# Patient Record
Sex: Female | Born: 1960 | Race: White | Hispanic: No | Marital: Married | Smoking: Current every day smoker
Health system: Southern US, Community
[De-identification: ages and names within clinical notes are randomized; demographics above are authoritative.]

## PROBLEM LIST (undated history)

## (undated) DIAGNOSIS — R51 Headache: Secondary | ICD-10-CM

## (undated) DIAGNOSIS — G4730 Sleep apnea, unspecified: Secondary | ICD-10-CM

## (undated) DIAGNOSIS — C801 Malignant (primary) neoplasm, unspecified: Secondary | ICD-10-CM

## (undated) DIAGNOSIS — E785 Hyperlipidemia, unspecified: Secondary | ICD-10-CM

## (undated) DIAGNOSIS — K219 Gastro-esophageal reflux disease without esophagitis: Secondary | ICD-10-CM

## (undated) DIAGNOSIS — I10 Essential (primary) hypertension: Secondary | ICD-10-CM

## (undated) DIAGNOSIS — F32A Depression, unspecified: Secondary | ICD-10-CM

## (undated) DIAGNOSIS — F329 Major depressive disorder, single episode, unspecified: Secondary | ICD-10-CM

## (undated) HISTORY — PX: WISDOM TOOTH EXTRACTION: SHX21

## (undated) HISTORY — PX: DILATION AND CURETTAGE OF UTERUS: SHX78

---

## 2000-07-24 ENCOUNTER — Ambulatory Visit (HOSPITAL_COMMUNITY): Admission: RE | Admit: 2000-07-24 | Discharge: 2000-07-24 | Payer: Self-pay | Admitting: Family Medicine

## 2000-07-24 ENCOUNTER — Encounter: Payer: Self-pay | Admitting: Family Medicine

## 2000-11-02 ENCOUNTER — Encounter: Payer: Self-pay | Admitting: Family Medicine

## 2000-11-02 ENCOUNTER — Ambulatory Visit (HOSPITAL_COMMUNITY): Admission: RE | Admit: 2000-11-02 | Discharge: 2000-11-02 | Payer: Self-pay | Admitting: Family Medicine

## 2005-08-31 ENCOUNTER — Ambulatory Visit: Payer: Self-pay | Admitting: *Deleted

## 2006-09-05 ENCOUNTER — Ambulatory Visit: Payer: Self-pay | Admitting: *Deleted

## 2007-12-23 ENCOUNTER — Ambulatory Visit: Payer: Self-pay | Admitting: Cardiology

## 2007-12-24 ENCOUNTER — Inpatient Hospital Stay (HOSPITAL_COMMUNITY): Admission: EM | Admit: 2007-12-24 | Discharge: 2007-12-25 | Payer: Self-pay | Admitting: Emergency Medicine

## 2007-12-24 ENCOUNTER — Encounter (INDEPENDENT_AMBULATORY_CARE_PROVIDER_SITE_OTHER): Payer: Self-pay | Admitting: Internal Medicine

## 2007-12-26 ENCOUNTER — Ambulatory Visit (HOSPITAL_COMMUNITY): Payer: Self-pay | Admitting: Neurology

## 2007-12-27 ENCOUNTER — Encounter (HOSPITAL_COMMUNITY): Admission: RE | Admit: 2007-12-27 | Discharge: 2008-01-07 | Payer: Self-pay | Admitting: Neurology

## 2008-03-17 ENCOUNTER — Ambulatory Visit (HOSPITAL_COMMUNITY): Payer: Self-pay | Admitting: Neurology

## 2008-03-17 ENCOUNTER — Encounter (HOSPITAL_COMMUNITY): Admission: RE | Admit: 2008-03-17 | Discharge: 2008-04-09 | Payer: Self-pay | Admitting: Oncology

## 2008-06-16 ENCOUNTER — Encounter (HOSPITAL_COMMUNITY): Admission: RE | Admit: 2008-06-16 | Discharge: 2008-07-16 | Payer: Self-pay | Admitting: Oncology

## 2008-06-16 ENCOUNTER — Ambulatory Visit (HOSPITAL_COMMUNITY): Payer: Self-pay | Admitting: Neurology

## 2009-01-15 ENCOUNTER — Encounter (HOSPITAL_COMMUNITY): Admission: RE | Admit: 2009-01-15 | Discharge: 2009-02-14 | Payer: Self-pay | Admitting: Neurology

## 2009-01-15 ENCOUNTER — Ambulatory Visit (HOSPITAL_COMMUNITY): Payer: Self-pay | Admitting: Neurology

## 2009-03-03 ENCOUNTER — Ambulatory Visit: Payer: Self-pay | Admitting: Family Medicine

## 2009-08-04 ENCOUNTER — Ambulatory Visit (HOSPITAL_COMMUNITY): Payer: Self-pay | Admitting: Psychiatry

## 2009-08-18 ENCOUNTER — Ambulatory Visit (HOSPITAL_COMMUNITY): Payer: Self-pay | Admitting: Psychiatry

## 2009-09-01 ENCOUNTER — Ambulatory Visit (HOSPITAL_COMMUNITY): Payer: Self-pay | Admitting: Psychiatry

## 2009-09-17 ENCOUNTER — Ambulatory Visit (HOSPITAL_COMMUNITY): Payer: Self-pay | Admitting: Psychiatry

## 2009-10-15 ENCOUNTER — Ambulatory Visit (HOSPITAL_COMMUNITY): Payer: Self-pay | Admitting: Psychiatry

## 2009-10-30 ENCOUNTER — Ambulatory Visit (HOSPITAL_COMMUNITY): Payer: Self-pay | Admitting: Psychiatry

## 2010-01-20 ENCOUNTER — Encounter: Admission: RE | Admit: 2010-01-20 | Discharge: 2010-01-20 | Payer: Self-pay | Admitting: Otolaryngology

## 2010-04-27 ENCOUNTER — Ambulatory Visit (HOSPITAL_COMMUNITY)
Admission: RE | Admit: 2010-04-27 | Discharge: 2010-04-27 | Payer: Self-pay | Source: Home / Self Care | Attending: Psychiatry | Admitting: Psychiatry

## 2010-07-27 ENCOUNTER — Ambulatory Visit: Payer: Self-pay | Admitting: Family Medicine

## 2010-08-24 NOTE — Discharge Summary (Signed)
[name], [name]              ACCOUNT NO.:  [id number]   MEDICAL RECORD NO.:  [id number]          PATIENT TYPE:  INP   LOCATION:  IC03                          FACILITY:  [facility]   PHYSICIAN:  Osvaldo Shipper, MD     DATE OF BIRTH:  July 19, 1960   DATE OF ADMISSION:  12/23/2007  DATE OF DISCHARGE:  09/15/2009LH                               DISCHARGE SUMMARY   PRIMARY CARE PHYSICIAN:  The patient's PMD is not in this community but  he does have a doctor.   DISCHARGE DIAGNOSES:  1. Left-sided numbness and weakness, resolved.  2. Abnormal MRI of the brain requiring neurological followup.  3. Dyslipidemia with LDL of 150 and triglycerides of 233, requiring      followup with primary medical doctor.   BRIEF HOSPITAL COURSE:  Briefly, this is a 50 year old Caucasian female  who presented to the ED with complaints of numbness of the left side of  the face and left upper extremity.  The patient's symptoms started about  a couple of days ago.  She denied any swallowing difficulties.  She did  have some headache.  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  As a result, the patient was  admitted to the hospital.  She underwent MRI of the brain which revealed  no evidence for acute ischemia.  However, nonspecific subcortical white  matter changes supratentorially were seen and they are thought to  represent areas of ischemic gliosis  related to small vessel disease  secondary to hypertension and/or diabetes versus demyelinating process  or vasculitis.  The patient's MRA did not show any significant disease.  Carotid Dopplers also did not show any significant disease.  She also  had an echocardiogram which showed an EF of 60-65%.  Trivial aortic  regurgitation was noted.  Otherwise no other significant abnormalities  were present.   The patient's left-sided numbness has  completely resolved.  She has been  ambulating with no difficulty and not requiring assistance.  We did  order consultation with Dr. Gerilyn Pilgrim.  He did not see her yesterday  because of miscommunication.  We are waiting for the neurologist to see  her this morning.  However, since the patient has improved, I think it  may also be reasonable for her to see him as an outpatient.  So we will  probably discharge her by 9:30 this morning if he does not see her.  I  have explained the MRI findings to her.  I have told her that Dr.  Gerilyn Pilgrim will need to discuss those findings in greater detail with her  and she might have to undergo additional testing.   On the day of discharge she is feeling quite well.  No complaints  offered.  Her symptoms resolved completely.  Vital signs are all stable.  Blood pressure is very well controlled without medications.  Examination  otherwise was unremarkable.  Neurologically she does not have any more  focal deficits.   DISCHARGE MEDICATIONS:  Bayer aspirin 81 mg daily.  She also needs to be  on a statin which I have discussed with her and I will actually go ahead  and prescribe, and I have told her to discuss this with her doctor as  well.   Followup with Dr. Gerilyn Pilgrim as soon as possible and with her PMD within 2-  3 weeks.   ACTIVITY:  No restrictions.   DIET:  Heart healthy.   Total time on this discharge encounter:  35 minutes.   ADDENDUM:  Patient was seen by Dr. Gerilyn Pilgrim prior to discharge. He felt that the  MRI was consistent with a diagnosis of Multiple Sclerosis. He ordered  pulse high dose steroids, with first dose to be given prior to discharge  and other doses to be received as an outpatient.      Osvaldo Shipper, MD  Electronically Signed     GK/MEDQ  D:  12/25/2007  T:  12/25/2007  Job:  [id number]   cc:   Darleen Crocker A. Gerilyn Pilgrim, M.D.  Fax: [phone]

## 2010-08-24 NOTE — H&P (Signed)
[name], [name]              ACCOUNT NO.:  [id number]   MEDICAL RECORD NO.:  [id number]          PATIENT TYPE:  INP   LOCATION:  IC03                          FACILITY:  [facility]   PHYSICIAN:  Margaretmary Dys, M.D.DATE OF BIRTH:  [birth year]   DATE OF ADMISSION:  12/23/2007  DATE OF DISCHARGE:  LH                              HISTORY & PHYSICAL   ADMISSION DIAGNOSES:  1. Acute cerebrovascular accident involving the right hemisphere.  2. Poorly controlled hypertension.  3. Uncontrolled dyslipidemia.   CHIEF COMPLAINT:  Numbness of the right arm and leg of 1 day duration.   HISTORY OF PRESENT ILLNESS:  [name] is a 50 year old female who was  diagnosed with hypertension several months ago, but who was not taking  any medications and did not follow up with her primary physician.   The patient complained of numbness which was fairly acute over the past  few hours.  The patient says that it has been coming on and off, but had  become persistent over the last several was which prompted her to come  to the hospital.  She said the numbness is mostly on the left side of  her face and left upper extremity.  She is not really sure if it affects  her legs.  She denies any weakness or falling to that left side.  She  denies any visual changes.  She said she had some headache around the  time of the numbness.  She has had no swallowing difficulty.  She denies  any nausea or vomiting, abdominal pain, no weight loss.  No night sweats  of sick contacts recently.  The patient denies any illicit drug use.  Evaluation in the emergency room revealed an abnormal CT scan on the  right hemisphere raising concern for possible cerebrovascular accident.  The patient has now been admitted for the management and care.   REVIEW OF SYSTEMS:  A 10-point review of systems otherwise negative  except as mentioned in history of present illness above.   PAST MEDICAL HISTORY:  1. Hypertension, which is not  being treated.  2. Dyslipidemia.  The patient not on any medications.   MEDICATIONS:  None.   ALLERGIES:  No known drug allergies.   FAMILY HISTORY:  Positive for hypertension and coronary artery disease.   SOCIAL HISTORY:  The patient is married, lives with her husband.  She  smokes about one pack of cigarettes a day.  She has two children.  She  does not drink alcohol.  She denies any illicit drug use.   PHYSICAL EXAMINATION:  GENERAL APPEARANCE:  The patient was conscious,  alert, comfortable, not in acute distress, well oriented in time, place  and person.  VITAL SIGNS:  Blood pressure on arrival in the emergency room was 161/99  with a pulse of 90, respiration was 16, temperature was 98.3 degrees  Fahrenheit, oxygen saturation was 100% on room air.  HEENT:  Normocephalic, atraumatic.  Oral mucosa was moist with no  exudates.  NECK:  Supple.  No JVD or lymphadenopathy.  LUNGS:  Clear clinically with good air entry bilaterally.  HEART:  S1-S2 regular.  No S3, S4, gallops or rubs.  ABDOMEN:  Soft, nontender.  Bowel sounds positive.  No masses palpable.  EXTREMITIES:  No pitting pedal edema.  No calf induration or tenderness  was noted.  CNS:  The patient was conscious, alert, well-oriented to time, place and  person.  There was no objective evidence of decreased sensation or palsy  or weakness in any of her extremities.  Pronator drift was negative.   LABORATORY/DIAGNOSTIC DATA:  White blood cell count was 9.7, hemoglobin  13.4, hematocrit 39.1, platelet count was 255 with no left shift.  Sodium is 140, potassium 3.9, chloride 108, CO2 was 27, glucose 107, BUN  of 9, creatinine was 0.77.  Liver function test was normal.   A CT scan of the head showed subacute to chronic subcortical stroke  within the right centrum semiovale.   ASSESSMENT/PLAN:  [name] is a 51 year old female presenting with  some left-sided numbness, CT scan suggestive of an acute stroke.   PLAN:  1.  Admit to the medical floor with telemetry.  2. The patient will have workup for stroke including MRI and MRA in      the morning.  3. We will start patient on aspirin 81 mg p.o. once a day.  4. We will monitor her blood pressure very closely.  Will not start on      antihypertensives for now.  5. Obtain additional blood work including a fasting lipid profile and      an RPR in the morning.  6. I will request neurology consult with Dr. Gerilyn Pilgrim.  7. Will obtain a carotid Doppler and also an echocardiogram to be read      by [facility] in the morning.  8. Will obtain physical therapy consult, however, the patient did not      seem to have any evidence of neurologic deficits during my exam.   We have discussed the above plan with the patient who verbalized full  understanding.      Margaretmary Dys, M.D.  Electronically Signed     AM/MEDQ  D:  12/24/2007  T:  12/24/2007  Job:  [id number]

## 2010-08-24 NOTE — Consult Note (Signed)
[name], [name]              ACCOUNT NO.:  [id number]   MEDICAL RECORD NO.:  [id number]          PATIENT TYPE:  INP   LOCATION:  IC03                          FACILITY:  [facility]   PHYSICIAN:  Kofi A. Gerilyn Pilgrim, M.D. DATE OF BIRTH:  July 16, 1960   DATE OF CONSULTATION:  12/25/2007  DATE OF DISCHARGE:  12/25/2007                                 CONSULTATION   The patient is a 50 year old white female who was diagnosed with  hypertension several months ago.  She was not on any medications.  Blood  pressure has been controlled here in the hospital.  She presented with  numbness of the left upper extremity and left facial region.  This  started initially above in the left hand and then progressed above the  entire left upper extremity and left facial region.  She had somewhat of  stuttering and fluctuating course.  The total event appears to be lasted  about 24-36 hours.  She reports being normal now.  There was no previous  history of any events like this.  She denies any headaches or  dysarthria.  She does report having some weakness involving the left  extremity, the left leg seem not to be involved.   PAST MEDICAL HISTORY:  1. Dyslipidemia.  2. Hypertension.   ALLERGIES:  None known.   FAMILY HISTORY:  Significant for hypertension and coronary artery  disease.   SOCIAL HISTORY:  She is married, lives with her husband.  She smokes a  pack of cigarette per day.  She has 2 children.  No alcohol use.  No  illicit drug use.   REVIEW OF SYSTEMS:  Unremarkable other than stated in the history of  present illness.   PHYSICAL EXAMINATION:  GENERAL:  Shows a pleasant lady in no acute  distress.  VITAL SIGNS:  Blood pressure 130/78 and pulse 77.  HEENT:  Neck is supple.  Head is normocephalic atraumatic.  ABDOMEN:  Soft.  EXTREMITIES:  No significant edema or varicosities.  MENTATION:  She is awake and alert.  Speech, language, and cognition are  intact.  CRANIAL NERVES:  Pupils  were equal, round, and reactive to light and  accommodation.  Extraocular movements are intact  The visual fields are  full.  Facial muscle strength is symmetric. Tongue is midline.  Uvula  midline.  Shoulder shrug is normal.  MOTOR EXAMINATION:  Shows normal tone, bulk, and strength.  There is no  pronator drift.  Sensation is normal to temperature and light touch at  this time.  Coordination,  there is no dysmetria, pass pointing, or  tremors.  Reflexes are preserved and essentially normal, plantars are  both flexor.   Initial head CT scan showed lucency involving the right centrum  semiovale.  MRI is reviewed in person and shows 16 white matter lesions  in the deep white matter area.  Several of these line are perpendicular  to the ventricle and she even have a couple that extends out from the  corpus callosum consistent with Dawson's fingers.  The test was done  without contrast.  MRA is negative.  Carotid Doppler is negative.  ESR  30, WBC 8.9, hemoglobin 13, platelet count of 239, LFTs normal.  Sodium  140, potassium 3.9, chloride 108, CO2 of 27, BUN 9, creatinine 0.7,  glucose 107, RPR nonreactive, C-reactive protein 0.3 normal, total  cholesterol high at 222, triglyceride also high at 233, and LDL 150, HDL  low at 24.   ASSESSMENT:  1. This is a patient who presents with left arm and face numbness and      weakness lasted for about 24-36 hours.  Given her age and the lack      of a discreet infarct on diffusion imaging, I believe this patient      has likely multiple sclerosis.  The MRI findings are highly      suggestive.  2. Dyslipidemia.  3. Hypertension.   RECOMMENDATIONS:  She will eventually have a spinal tap done.  In the  meantime, I think we will treat her with IV Solu-Medrol.  She is anxious  to go home.  We will give her the typical treatment of a gram for 3 days  IV.  We will start her initial today and she can get the other 2 doses  as an outpatient.   Thanks  for this consultation.      Kofi A. Gerilyn Pilgrim, M.D.  Electronically Signed     KAD/MEDQ  D:  12/26/2007  T:  12/26/2007  Job:  [id number]

## 2011-01-12 LAB — BASIC METABOLIC PANEL
BUN: 9
Chloride: 110
Creatinine, Ser: 0.66
GFR calc Af Amer: >60
GFR calc non Af Amer: >60

## 2011-01-12 LAB — CBC
Hemoglobin: 13.4
MCHC: 34.3
MCV: 85.4
MCV: 85.6
Platelets: 239
RBC: 4.5
RBC: 4.57
WBC: 8.9
WBC: 9.7

## 2011-01-12 LAB — LIPID PANEL
Cholesterol: 221 — ABNORMAL HIGH
HDL: 24 — ABNORMAL LOW
LDL Cholesterol: 150 — ABNORMAL HIGH
Total CHOL/HDL Ratio: 9.2
Triglycerides: 233 — ABNORMAL HIGH

## 2011-01-12 LAB — COMPREHENSIVE METABOLIC PANEL
ALT: 19
CO2: 27
Calcium: 8.9
Chloride: 108
Creatinine, Ser: 0.77
GFR calc non Af Amer: >60
Glucose, Bld: 107 — ABNORMAL HIGH
Total Bilirubin: 0.3

## 2011-01-12 LAB — DIFFERENTIAL
Basophils Absolute: 0.1
Basophils Absolute: 0.1
Eosinophils Absolute: 0.2
Eosinophils Relative: 3
Eosinophils Relative: 3
Lymphocytes Relative: 22
Lymphocytes Relative: 31
Lymphs Abs: 2.1
Lymphs Abs: 2.8
Neutro Abs: 5.3
Neutrophils Relative %: 59
Neutrophils Relative %: 68

## 2011-01-12 LAB — C-REACTIVE PROTEIN: CRP: 0.1 — ABNORMAL LOW (ref <0.6)

## 2011-01-12 LAB — SEDIMENTATION RATE: Sed Rate: 3

## 2011-08-19 ENCOUNTER — Encounter (INDEPENDENT_AMBULATORY_CARE_PROVIDER_SITE_OTHER): Payer: Self-pay | Admitting: *Deleted

## 2011-08-23 ENCOUNTER — Ambulatory Visit: Payer: Self-pay | Admitting: Family Medicine

## 2011-08-31 ENCOUNTER — Encounter (INDEPENDENT_AMBULATORY_CARE_PROVIDER_SITE_OTHER): Payer: Self-pay | Admitting: *Deleted

## 2011-08-31 ENCOUNTER — Telehealth (INDEPENDENT_AMBULATORY_CARE_PROVIDER_SITE_OTHER): Payer: Self-pay | Admitting: *Deleted

## 2011-08-31 ENCOUNTER — Other Ambulatory Visit (INDEPENDENT_AMBULATORY_CARE_PROVIDER_SITE_OTHER): Payer: Self-pay | Admitting: *Deleted

## 2011-08-31 DIAGNOSIS — Z1211 Encounter for screening for malignant neoplasm of colon: Secondary | ICD-10-CM

## 2011-08-31 MED ORDER — PEG-KCL-NACL-NASULF-NA ASC-C 100 G PO SOLR: 1.0000 | Freq: Once | ORAL | Status: DC

## 2011-08-31 NOTE — Telephone Encounter (Signed)
Patient needs movi prep 

## 2011-09-20 ENCOUNTER — Telehealth (INDEPENDENT_AMBULATORY_CARE_PROVIDER_SITE_OTHER): Payer: Self-pay | Admitting: *Deleted

## 2011-09-20 NOTE — Telephone Encounter (Signed)
PCP/Requesting MD: Dr Kara Mead williams  Name & DOB: [name] Jan 16, 2061     Procedure: TCS  Reason/Indication:  screening  Has patient had this procedure before?  no  If so, when, by whom and where?    Is there a family history of colon cancer?  no  Who?  What age when diagnosed?    Is patient diabetic?   no      Does patient have prosthetic heart valve?  no  Do you have a pacemaker?  no  Has patient had joint replacement within last 12 months?  no  Is patient on Coumadin, Plavix and/or Aspirin? yes  Medications: asa 81 mg daily, lisniopril 10 mg daily, nexium 40 mg daily, simvastatin 20 mg daily, cymbalta 60 mg daily, vit D, butal-acet 40 mg prn, imitrex 100 mg prn, phenergan 25 mg prn  Allergies: none  Medication Adjustment: asa 2 days  Procedure date & time: 10/12/11 at 1030

## 2011-09-23 NOTE — Telephone Encounter (Signed)
agree

## 2011-10-03 ENCOUNTER — Encounter (HOSPITAL_COMMUNITY): Payer: Self-pay | Admitting: Pharmacy Technician

## 2011-10-04 ENCOUNTER — Encounter (HOSPITAL_COMMUNITY): Payer: Self-pay | Admitting: Pharmacy Technician

## 2011-10-12 ENCOUNTER — Ambulatory Visit (HOSPITAL_COMMUNITY)
Admission: RE | Admit: 2011-10-12 | Discharge: 2011-10-12 | Disposition: A | Discharge status: Home / Self Care | Payer: BC Managed Care – PPO | Source: Ambulatory Visit | Attending: Internal Medicine | Admitting: Internal Medicine

## 2011-10-12 ENCOUNTER — Encounter (HOSPITAL_COMMUNITY): Payer: Self-pay | Admitting: *Deleted

## 2011-10-12 ENCOUNTER — Encounter (HOSPITAL_COMMUNITY)
Admission: RE | Disposition: A | Discharge status: Home / Self Care | Payer: Self-pay | Source: Ambulatory Visit | Attending: Internal Medicine

## 2011-10-12 DIAGNOSIS — E785 Hyperlipidemia, unspecified: Secondary | ICD-10-CM | POA: Insufficient documentation

## 2011-10-12 DIAGNOSIS — Z1211 Encounter for screening for malignant neoplasm of colon: Secondary | ICD-10-CM

## 2011-10-12 DIAGNOSIS — K644 Residual hemorrhoidal skin tags: Secondary | ICD-10-CM | POA: Insufficient documentation

## 2011-10-12 DIAGNOSIS — Z79899 Other long term (current) drug therapy: Secondary | ICD-10-CM | POA: Insufficient documentation

## 2011-10-12 DIAGNOSIS — I10 Essential (primary) hypertension: Secondary | ICD-10-CM | POA: Insufficient documentation

## 2011-10-12 HISTORY — PX: COLONOSCOPY: SHX5424

## 2011-10-12 HISTORY — DX: Depression, unspecified: F32.A

## 2011-10-12 HISTORY — DX: Sleep apnea, unspecified: G47.30

## 2011-10-12 HISTORY — DX: Essential (primary) hypertension: I10

## 2011-10-12 HISTORY — DX: Hyperlipidemia, unspecified: E78.5

## 2011-10-12 HISTORY — DX: Headache: R51

## 2011-10-12 HISTORY — DX: Major depressive disorder, single episode, unspecified: F32.9

## 2011-10-12 HISTORY — DX: Gastro-esophageal reflux disease without esophagitis: K21.9

## 2011-10-12 SURGERY — COLONOSCOPY
Anesthesia: Moderate Sedation

## 2011-10-12 MED ORDER — MEPERIDINE HCL 50 MG/ML IJ SOLN
INTRAMUSCULAR | Status: AC
  Filled 2011-10-12: qty 1

## 2011-10-12 MED ORDER — MIDAZOLAM HCL 5 MG/5ML IJ SOLN
INTRAMUSCULAR | Status: DC | PRN
  Administered 2011-10-12 (×5): 2 mg via INTRAVENOUS

## 2011-10-12 MED ORDER — SODIUM CHLORIDE 0.45 % IV SOLN
Freq: Once | INTRAVENOUS | Status: AC
  Administered 2011-10-12: 1000 mL via INTRAVENOUS

## 2011-10-12 MED ORDER — MEPERIDINE HCL 50 MG/ML IJ SOLN
INTRAMUSCULAR | Status: DC | PRN
  Administered 2011-10-12 (×3): 25 mg via INTRAVENOUS

## 2011-10-12 MED ORDER — STERILE WATER FOR IRRIGATION IR SOLN
Status: DC | PRN
  Administered 2011-10-12: 11:00:00

## 2011-10-12 MED ORDER — MIDAZOLAM HCL 5 MG/5ML IJ SOLN
INTRAMUSCULAR | Status: AC
  Filled 2011-10-12: qty 10

## 2011-10-12 NOTE — Op Note (Signed)
COLONOSCOPY PROCEDURE REPORT  PATIENT:  [name]  MR#:  [id number] Birthdate:  January 25, 1961, 51 y.o., female Endoscopist:  Dr. Malissa Hippo, MD Referred By:  Dr. Ulanda Edison, MD Procedure Date: 10/12/2011  Procedure:   Colonoscopy  Indications:  Patient is 51 year old Caucasian female who is undergoing average risk screening colonoscopy.  Informed Consent:  The procedure and risks were reviewed with the patient and informed consent was obtained.  Medications:  Demerol 75mg  IV Versed 10 mg IV  Description of procedure:  After a digital rectal exam was performed, that colonoscope was advanced from the anus through the rectum and colon to the area of the cecum, ileocecal valve and appendiceal orifice. The cecum was deeply intubated. These structures were well-seen and photographed for the record. From the level of the cecum and ileocecal valve, the scope was slowly and cautiously withdrawn. The mucosal surfaces were carefully surveyed utilizing scope tip to flexion to facilitate fold flattening as needed. The scope was pulled down into the rectum where a thorough exam including retroflexion was performed.  Findings:   Prep satisfactory. Noncompliant sigmoid colon normal mucosa throughout. Normal rectal mucosa Small hemorrhoids below the dentate line.  Therapeutic/Diagnostic Maneuvers Performed:  None  Complications:  None  Cecal Withdrawal Time:  9 minutes  Impression:  Examination performed to cecum. Noncompliant sigmoid colon. No evidence of colonic polyps. Small external hemorrhoids.  Recommendations:  Standard instructions given. Next screening exam in 10 years.  REHMAN,NAJEEB U  10/12/2011 11:31 AM  CC: Dr. Ulanda Edison, MD & Dr. Bonnetta Barry ref. provider found

## 2011-10-12 NOTE — H&P (Signed)
[name] is an 51 y.o. female.   Chief Complaint: Patient is here for colonoscopy. HPI: Patient is 51 year old Caucasian female was in for average risk and colonoscopy. This is patient's first exam. Family history is negative for colorectal carcinoma. She denies rectal bleeding or recent change in her bowel habits.  Past Medical History  Diagnosis Date  . Hypertension   . Hyperlipemia   . Depression   . Sleep apnea   . GERD (gastroesophageal reflux disease)   . Headache     Past Surgical History  Procedure Date  . Wisdom tooth extraction   . Dilation and curettage of uterus     1986    Family History  Problem Relation Age of Onset  . COPD Mother   . COPD Father    Social History:  reports that she has been smoking.  She does not have any smokeless tobacco history on file. She reports that she does not drink alcohol or use illicit drugs.  Allergies: No Known Allergies  Medications Prior to Admission  Medication Sig Dispense Refill  . aspirin EC 81 MG tablet Take 81 mg by mouth daily.      . butalbital-acetaminophen-caffeine (FIORICET, ESGIC) 50-325-40 MG per tablet Take 1 tablet by mouth every 6 (six) hours as needed. For migraines      . DULoxetine (CYMBALTA) 60 MG capsule Take 60 mg by mouth every evening.      Marland Kitchen esomeprazole (NEXIUM) 40 MG capsule Take 40 mg by mouth daily.      Marland Kitchen lisinopril (PRINIVIL,ZESTRIL) 10 MG tablet Take 10 mg by mouth every evening.      . peg 3350 powder (MOVIPREP) 100 G SOLR Take 1 kit (100 g total) by mouth once.  1 kit  0  . promethazine (PHENERGAN) 25 MG tablet Take 25 mg by mouth every 6 (six) hours as needed. For nausea/vomiting      . simvastatin (ZOCOR) 20 MG tablet Take 20 mg by mouth every evening.      . SUMAtriptan (IMITREX) 100 MG tablet Take 100 mg by mouth every 2 (two) hours as needed. For migraines      . Vitamin D, Ergocalciferol, (DRISDOL) 50000 UNITS CAPS Take 50,000 Units by mouth every 7 (seven) days. On wednesday          No results found for this or any previous visit (from the past 48 hour(s)). No results found.  ROS  Blood pressure 138/81, pulse 89, temperature 97.6 F (36.4 C), temperature source Oral, resp. rate 20, height 5\' 2"  (1.575 m), weight 145 lb (65.772 kg), last menstrual period 08/14/2011, SpO2 98.00%. Physical Exam  Constitutional: She appears well-developed and well-nourished.  HENT:  Mouth/Throat: Oropharynx is clear and moist.  Eyes: Conjunctivae are normal. No scleral icterus.  Neck: No thyromegaly present.  Cardiovascular: Normal rate, regular rhythm and normal heart sounds.   No murmur heard. Respiratory: Effort normal and breath sounds normal.  GI: Soft. She exhibits no distension and no mass. There is no tenderness.  Musculoskeletal: She exhibits no edema.  Lymphadenopathy:    She has no cervical adenopathy.  Neurological: She is alert.  Skin: Skin is warm.     Assessment/Plan Average risk screening colonoscopy.  REHMAN,NAJEEB U 10/12/2011, 10:46 AM

## 2011-10-17 ENCOUNTER — Encounter (HOSPITAL_COMMUNITY): Payer: Self-pay | Admitting: Internal Medicine

## 2013-08-30 ENCOUNTER — Encounter (INDEPENDENT_AMBULATORY_CARE_PROVIDER_SITE_OTHER): Payer: Self-pay | Admitting: *Deleted

## 2013-09-05 ENCOUNTER — Encounter (INDEPENDENT_AMBULATORY_CARE_PROVIDER_SITE_OTHER): Payer: Self-pay | Admitting: Internal Medicine

## 2013-09-05 ENCOUNTER — Ambulatory Visit (INDEPENDENT_AMBULATORY_CARE_PROVIDER_SITE_OTHER): Payer: 59 | Admitting: Internal Medicine

## 2013-09-05 VITALS — BP 110/70 | HR 56 | Temp 98.1°F | Ht 62.0 in | Wt 148.8 lb

## 2013-09-05 DIAGNOSIS — R142 Eructation: Secondary | ICD-10-CM

## 2013-09-05 DIAGNOSIS — E7800 Pure hypercholesterolemia, unspecified: Secondary | ICD-10-CM

## 2013-09-05 DIAGNOSIS — F32A Depression, unspecified: Secondary | ICD-10-CM | POA: Insufficient documentation

## 2013-09-05 DIAGNOSIS — F329 Major depressive disorder, single episode, unspecified: Secondary | ICD-10-CM

## 2013-09-05 DIAGNOSIS — F3289 Other specified depressive episodes: Secondary | ICD-10-CM

## 2013-09-05 DIAGNOSIS — R141 Gas pain: Secondary | ICD-10-CM

## 2013-09-05 DIAGNOSIS — R140 Abdominal distension (gaseous): Secondary | ICD-10-CM

## 2013-09-05 DIAGNOSIS — I10 Essential (primary) hypertension: Secondary | ICD-10-CM | POA: Insufficient documentation

## 2013-09-05 DIAGNOSIS — R143 Flatulence: Secondary | ICD-10-CM

## 2013-09-05 NOTE — Progress Notes (Addendum)
Subjective:     Patient ID: [name], female   DOB: 1961-02-15, 53 y.o.   MRN: [id number]  HPI Presents today with c/o that her allergies are bad. She says when she eats sometimes, it feels like foods are lodging.  She says her foods are going down very slow. Even when she drinks liquids, it feels like it is slow to go down. At night sometimes she has acid reflux.   A week ago (Wednesday), she was constipated.After 2-3 days, she took 2 stool softenesr, and she had a small BM Monday. Monday, she took another stool softener. Tuesday she had a couple of BMs but were small but she did feel better. She says she feels bloated now. She has some flatus, but not a lot. Tender to the touch in her epigastric region. Her appetite is good. No weight loss. No melena or bright red rectal bleeding.  10/12/2011 Colonoscopy Dr. Laural Golden: screening: Impression:  Examination performed to cecum.  Noncompliant sigmoid colon.  No evidence of colonic polyps.  Small external hemorrhoids.   Review of Systems Past Medical History  Diagnosis Date  . Hypertension   . Hyperlipemia   . Depression   . Sleep apnea   . GERD (gastroesophageal reflux disease)   . VPXTGGYI(948.5)     Past Surgical History  Procedure Laterality Date  . Wisdom tooth extraction    . Dilation and curettage of uterus      1986  . Colonoscopy  10/12/2011    Procedure: COLONOSCOPY;  Surgeon: Rogene Houston, MD;  Location: [facility];  Service: Endoscopy;  Laterality: N/A;  1030    No Known Allergies  Current Outpatient Prescriptions on File Prior to Visit  Medication Sig Dispense Refill  . esomeprazole (NEXIUM) 40 MG capsule Take 40 mg by mouth daily.      Marland Kitchen lisinopril (PRINIVIL,ZESTRIL) 10 MG tablet Take 5 mg by mouth every evening.       . simvastatin (ZOCOR) 20 MG tablet Take 20 mg by mouth every evening.      . Vitamin D, Ergocalciferol, (DRISDOL) 50000 UNITS CAPS Take 50,000 Units by mouth every 7 (seven) days. On wednesday        No current facility-administered medications on file prior to visit.        Objective:   Physical Exam  Filed Vitals:   09/05/13 1030  BP: 110/70  Pulse: 56  Temp: 98.1 F (36.7 C)  Height: 5\' 2"  (1.575 m)  Weight: 148 lb 12.8 oz (67.495 kg)   Alert and oriented. Skin warm and dry. Oral mucosa is moist.   . Sclera anicteric, conjunctivae is pink. Thyroid not enlarged. No cervical lymphadenopathy. Lungs clear. Heart regular rate and rhythm.  Abdomen is soft. Bowel sounds are positive. No hepatomegaly.  Firmness to epigastric region, with guarding.           Assessment:     Abdominal distention, bloating, change in her bowels. Abdominal pain. ? Etiology.     Plan:     CT abdomen/pelvis with CM. Will consider and EGD/ED when CT scan is back.

## 2013-09-05 NOTE — Patient Instructions (Signed)
CT abdomen/pelvis with CM. May need an EGD/ED after CT is completer.

## 2013-09-11 ENCOUNTER — Telehealth (INDEPENDENT_AMBULATORY_CARE_PROVIDER_SITE_OTHER): Payer: Self-pay | Admitting: Internal Medicine

## 2013-09-11 ENCOUNTER — Ambulatory Visit (HOSPITAL_COMMUNITY)
Admission: RE | Admit: 2013-09-11 | Discharge: 2013-09-11 | Disposition: A | Discharge status: Home / Self Care | Payer: 59 | Source: Ambulatory Visit | Attending: Internal Medicine | Admitting: Internal Medicine

## 2013-09-11 DIAGNOSIS — R141 Gas pain: Secondary | ICD-10-CM | POA: Insufficient documentation

## 2013-09-11 DIAGNOSIS — R140 Abdominal distension (gaseous): Secondary | ICD-10-CM

## 2013-09-11 DIAGNOSIS — R142 Eructation: Secondary | ICD-10-CM

## 2013-09-11 DIAGNOSIS — K769 Liver disease, unspecified: Secondary | ICD-10-CM

## 2013-09-11 DIAGNOSIS — R143 Flatulence: Secondary | ICD-10-CM

## 2013-09-11 DIAGNOSIS — K7460 Unspecified cirrhosis of liver: Secondary | ICD-10-CM | POA: Insufficient documentation

## 2013-09-11 MED ORDER — IOHEXOL 300 MG/ML  SOLN
100.0000 mL | Freq: Once | INTRAMUSCULAR | Status: AC | PRN
  Administered 2013-09-11: 100 mL via INTRAVENOUS

## 2013-09-11 NOTE — Telephone Encounter (Signed)
I discussed this case with Dr. Laural Golden, Abnormal CT with ? Liver lesions. Will get labs. She will need a liver biopsy in the near future.   Ann, EGD/ED with possible variceal banding.

## 2013-09-12 ENCOUNTER — Other Ambulatory Visit (INDEPENDENT_AMBULATORY_CARE_PROVIDER_SITE_OTHER): Payer: Self-pay | Admitting: *Deleted

## 2013-09-12 DIAGNOSIS — R140 Abdominal distension (gaseous): Secondary | ICD-10-CM

## 2013-09-12 DIAGNOSIS — R1310 Dysphagia, unspecified: Secondary | ICD-10-CM

## 2013-09-12 DIAGNOSIS — K769 Liver disease, unspecified: Secondary | ICD-10-CM

## 2013-09-12 LAB — COMPREHENSIVE METABOLIC PANEL
ALT: 63 U/L — AB (ref 0–35)
AST: 73 U/L — ABNORMAL HIGH (ref 0–37)
Albumin: 3.4 g/dL — ABNORMAL LOW (ref 3.5–5.2)
Alkaline Phosphatase: 161 U/L — ABNORMAL HIGH (ref 39–117)
BILIRUBIN TOTAL: 1.4 mg/dL — AB (ref 0.2–1.2)
BUN: 24 mg/dL — ABNORMAL HIGH (ref 6–23)
CO2: 33 meq/L — AB (ref 19–32)
CREATININE: 1.01 mg/dL (ref 0.50–1.10)
Calcium: 8.7 mg/dL (ref 8.4–10.5)
Chloride: 99 mEq/L (ref 96–112)
Glucose, Bld: 85 mg/dL (ref 70–99)
Potassium: 3.3 mEq/L — ABNORMAL LOW (ref 3.5–5.3)
Sodium: 144 mEq/L (ref 135–145)
Total Protein: 5.6 g/dL — ABNORMAL LOW (ref 6.0–8.3)

## 2013-09-12 NOTE — Telephone Encounter (Signed)
Patient wants to know if there is anything she can take for the bloating -- procedure is sch'd for 09/18/13

## 2013-09-13 ENCOUNTER — Encounter (HOSPITAL_COMMUNITY): Payer: Self-pay | Admitting: Pharmacy Technician

## 2013-09-13 LAB — CBC WITH DIFFERENTIAL/PLATELET
BASOS ABS: 0 10*3/uL (ref 0.0–0.1)
Basophils Relative: 0 % (ref 0–1)
EOS ABS: 0 10*3/uL (ref 0.0–0.7)
EOS PCT: 0 % (ref 0–5)
HCT: 41.6 % (ref 36.0–46.0)
Hemoglobin: 14.2 g/dL (ref 12.0–15.0)
LYMPHS ABS: 1.9 10*3/uL (ref 0.7–4.0)
Lymphocytes Relative: 14 % (ref 12–46)
MCH: 30.7 pg (ref 26.0–34.0)
MCHC: 34.1 g/dL (ref 30.0–36.0)
MCV: 90 fL (ref 78.0–100.0)
Monocytes Absolute: 0.5 10*3/uL (ref 0.1–1.0)
Monocytes Relative: 4 % (ref 3–12)
Neutro Abs: 11.2 10*3/uL — ABNORMAL HIGH (ref 1.7–7.7)
Neutrophils Relative %: 82 % — ABNORMAL HIGH (ref 43–77)
PLATELETS: 109 10*3/uL — AB (ref 150–400)
RBC: 4.62 MIL/uL (ref 3.87–5.11)
RDW: 14.5 % (ref 11.5–15.5)
WBC: 13.7 10*3/uL — ABNORMAL HIGH (ref 4.0–10.5)

## 2013-09-13 LAB — HEPATITIS B SURFACE ANTIGEN: HEP B S AG: NEGATIVE

## 2013-09-13 LAB — PROTIME-INR
INR: 1.15 (ref <1.50)
Prothrombin Time: 14.6 seconds (ref 11.6–15.2)

## 2013-09-13 LAB — FERRITIN: FERRITIN: 46 ng/mL (ref 10–291)

## 2013-09-13 LAB — AFP TUMOR MARKER: AFP TUMOR MARKER: 6.4 ng/mL (ref 0.0–8.0)

## 2013-09-13 LAB — HEPATITIS C ANTIBODY: HCV Ab: NEGATIVE

## 2013-09-14 ENCOUNTER — Emergency Department (HOSPITAL_COMMUNITY)
Admission: EM | Admit: 2013-09-14 | Discharge: 2013-09-14 | Disposition: A | Discharge status: Home / Self Care | Payer: 59 | Attending: Emergency Medicine | Admitting: Emergency Medicine

## 2013-09-14 ENCOUNTER — Encounter (HOSPITAL_COMMUNITY): Payer: Self-pay | Admitting: Emergency Medicine

## 2013-09-14 DIAGNOSIS — F329 Major depressive disorder, single episode, unspecified: Secondary | ICD-10-CM | POA: Insufficient documentation

## 2013-09-14 DIAGNOSIS — K219 Gastro-esophageal reflux disease without esophagitis: Secondary | ICD-10-CM | POA: Insufficient documentation

## 2013-09-14 DIAGNOSIS — F17200 Nicotine dependence, unspecified, uncomplicated: Secondary | ICD-10-CM | POA: Insufficient documentation

## 2013-09-14 DIAGNOSIS — R5383 Other fatigue: Secondary | ICD-10-CM

## 2013-09-14 DIAGNOSIS — Z79899 Other long term (current) drug therapy: Secondary | ICD-10-CM | POA: Insufficient documentation

## 2013-09-14 DIAGNOSIS — I10 Essential (primary) hypertension: Secondary | ICD-10-CM | POA: Insufficient documentation

## 2013-09-14 DIAGNOSIS — Z9889 Other specified postprocedural states: Secondary | ICD-10-CM | POA: Insufficient documentation

## 2013-09-14 DIAGNOSIS — R1907 Generalized intra-abdominal and pelvic swelling, mass and lump: Secondary | ICD-10-CM | POA: Insufficient documentation

## 2013-09-14 DIAGNOSIS — R160 Hepatomegaly, not elsewhere classified: Secondary | ICD-10-CM | POA: Insufficient documentation

## 2013-09-14 DIAGNOSIS — R5381 Other malaise: Secondary | ICD-10-CM | POA: Insufficient documentation

## 2013-09-14 DIAGNOSIS — F3289 Other specified depressive episodes: Secondary | ICD-10-CM | POA: Insufficient documentation

## 2013-09-14 DIAGNOSIS — Z3202 Encounter for pregnancy test, result negative: Secondary | ICD-10-CM | POA: Insufficient documentation

## 2013-09-14 DIAGNOSIS — E785 Hyperlipidemia, unspecified: Secondary | ICD-10-CM | POA: Insufficient documentation

## 2013-09-14 DIAGNOSIS — R110 Nausea: Secondary | ICD-10-CM | POA: Insufficient documentation

## 2013-09-14 LAB — URINALYSIS, ROUTINE W REFLEX MICROSCOPIC
GLUCOSE, UA: 100 mg/dL — AB
HGB URINE DIPSTICK: NEGATIVE
Ketones, ur: NEGATIVE mg/dL
Leukocytes, UA: NEGATIVE
Nitrite: NEGATIVE
Protein, ur: NEGATIVE mg/dL
SPECIFIC GRAVITY, URINE: 1.01 (ref 1.005–1.030)
Urobilinogen, UA: 2 mg/dL — ABNORMAL HIGH (ref 0.0–1.0)
pH: 6 (ref 5.0–8.0)

## 2013-09-14 LAB — PREGNANCY, URINE: Preg Test, Ur: NEGATIVE

## 2013-09-14 MED ORDER — SPIRONOLACTONE 25 MG PO TABS: 25.0000 mg | ORAL_TABLET | Freq: Every day | ORAL | Status: AC

## 2013-09-14 NOTE — ED Notes (Signed)
Discharge instructions and prescription given and reviewed with patient.  Patient verbalized understanding to take medication as prescribed and to follow up with GI doctor.  Patient discharged home in good condition.

## 2013-09-14 NOTE — ED Provider Notes (Signed)
CSN: [id number]     Arrival date & time 09/14/13  1735 History   First MD Initiated Contact with Patient 09/14/13 1817  This chart was scribed for Richarda Blade, MD by Anastasia Pall, ED Scribe. This patient was seen in room APA08/APA08 and the patient's care was started at 6:19 PM.     Chief Complaint  Patient presents with  . Abdominal Pain    (Consider location/radiation/quality/duration/timing/severity/associated sxs/prior Treatment) The history is provided by the patient and a relative. No language interpreter was used.   HPI Comments: [name] is a 53 y.o. female who presents to the Emergency Department complaining of intermittent abdominal pain and distention, with associated nausea, onset 2 weeks ago. She states she saw Dr. Laural Golden for GI issues including burning in her stomach and throat at that time. She had a CT abdomen resulting in spots on her liver. She denies being told she had fluid around her liver. She states she had blood work done there and will be getting the results in 2 days. She reports being set up for an endoscopy on 06/19.   She has tried Gas-X for her bloating without relief. She takes her nausea medication PRN. She reports she has not been eating as much due to the burning in her stomach and throat. She reports having bowel movements productive of minimal stool. She reports weakness secondary to her lack of eating. She denies fever, constipation, and any other associated symptoms. She lives in [place]. She has h/o smoking, decreased recently. She denies EtOH use.   PCP -  Dear, Trude Mcburney, MD  Past Medical History  Diagnosis Date  . Hypertension   . Hyperlipemia   . Depression   . Sleep apnea   . GERD (gastroesophageal reflux disease)   . VQMGQQPY(195.0)    Past Surgical History  Procedure Laterality Date  . Wisdom tooth extraction    . Dilation and curettage of uterus      1986  . Colonoscopy  10/12/2011    Procedure: COLONOSCOPY;  Surgeon:  Rogene Houston, MD;  Location: [facility];  Service: Endoscopy;  Laterality: N/A;  1030   Family History  Problem Relation Age of Onset  . COPD Mother   . COPD Father    History  Substance Use Topics  . Smoking status: Current Every Day Smoker -- 1.00 packs/day for 30 years  . Smokeless tobacco: Not on file     Comment: a pack or more a day x 30 yrs.  . Alcohol Use: No   OB History   Grav Para Term Preterm Abortions TAB SAB Ect Mult Living                 Review of Systems  Constitutional: Positive for fatigue (secondary to not eating). Negative for fever.  Gastrointestinal: Positive for nausea, abdominal pain and abdominal distention. Negative for diarrhea and constipation.  All other systems reviewed and are negative.  Allergies  Review of patient's allergies indicates no known allergies.  Home Medications   Prior to Admission medications   Medication Sig Start Date End Date Taking? Authorizing Provider  azelastine (ASTELIN) 0.1 % nasal spray Place into both nostrils 2 (two) times daily. Use in each nostril as directed   Yes Historical Provider, MD  citalopram (CELEXA) 10 MG tablet Take 10 mg by mouth daily.   Yes Historical Provider, MD  esomeprazole (NEXIUM) 40 MG capsule Take 40 mg by mouth daily.   Yes Historical Provider,  MD  lisinopril (PRINIVIL,ZESTRIL) 10 MG tablet Take 5 mg by mouth every evening.    Yes Historical Provider, MD  montelukast (SINGULAIR) 10 MG tablet Take 10 mg by mouth at bedtime.   Yes Historical Provider, MD  promethazine (PHENERGAN) 12.5 MG tablet Take 12.5 mg by mouth every 6 (six) hours as needed. nausea 06/30/13  Yes Historical Provider, MD  simvastatin (ZOCOR) 20 MG tablet Take 20 mg by mouth every evening.   Yes Historical Provider, MD  traZODone (DESYREL) 100 MG tablet Take 50-100 mg by mouth at bedtime. 08/25/13  Yes Historical Provider, MD  Vitamin D, Ergocalciferol, (DRISDOL) 50000 UNITS CAPS Take 50,000 Units by mouth every 7 (seven)  days. On wednesday    Historical Provider, MD   BP 122/73  Pulse 84  Temp(Src) 99 F (37.2 C) (Oral)  Resp 20  Ht 5\' 2"  (1.575 m)  SpO2 97% Physical Exam  Nursing note and vitals reviewed. Constitutional: She is oriented to person, place, and time. She appears well-developed and well-nourished.  HENT:  Head: Normocephalic and atraumatic.  Eyes: Conjunctivae and EOM are normal. Pupils are equal, round, and reactive to light.  Neck: Normal range of motion and phonation normal. Neck supple.  Cardiovascular: Normal rate, regular rhythm and intact distal pulses.   Pulmonary/Chest: Effort normal. She exhibits no tenderness.  Abdominal: Soft. Bowel sounds are normal. She exhibits mass. There is hepatomegaly.  Positive fluid wave. Midline mass consistent with hepatomegaly.   Musculoskeletal: Normal range of motion. She exhibits no edema and no tenderness.  Neurological: She is alert and oriented to person, place, and time. She exhibits normal muscle tone.  Skin: Skin is warm and dry.  Psychiatric: She has a normal mood and affect. Her behavior is normal. Judgment and thought content normal.   ED Course  Procedures (including critical care time)  DIAGNOSTIC STUDIES: Oxygen Saturation is 97% on room air, normal by my interpretation.    COORDINATION OF CARE: 6:30 PM- Discussed previous lab work results with pt including doubt of Hepatitis. Discussed treatment plan with pt at bedside and pt agreed to plan.   Medications - No data to display  Patient Vitals for the past 24 hrs:  BP Temp Temp src Pulse Resp SpO2 Height  09/14/13 1741 122/73 mmHg 99 F (37.2 C) Oral 84 20 97 % 5\' 2"  (1.575 m)     EKG Interpretation None      MDM   Final diagnoses:  Abdominal swelling, generalized    Evaluation is consistent with early abdominal ascites, secondary to nonspecific liver disease. No evidence for spontaneous bacterial peritonitis, acute pulmonary hepatitis, metabolic instability, or  serious bacterial infection.  Nursing Notes Reviewed/ Care Coordinated Applicable Imaging Reviewed Interpretation of Laboratory Data incorporated into ED treatment  The patient appears reasonably screened and/or stabilized for discharge and I doubt any other medical condition or other [facility] requiring further screening, evaluation, or treatment in the ED at this time prior to discharge.  Plan: Home Medications- Spironolactone; Home Treatments- rest; return here if the recommended treatment, does not improve the symptoms; Recommended follow up- PCP prn  I personally performed the services described in this documentation, which was scribed in my presence. The recorded information has been reviewed and is accurate.     Richarda Blade, MD 09/14/13 [phone]

## 2013-09-14 NOTE — ED Notes (Signed)
Complain of abdominal pain and bloating. Denies other symptoms

## 2013-09-14 NOTE — Discharge Instructions (Signed)
The swelling in your abdomen is most likely ascites fluid.   Followup with your GI doctor, as soon as possible to discuss further treatment options and to be evaluated for the swelling in your abdomen.  You can try using Maalox, or Mylanta, before meals and at bedtime to help with the burning pain in your chest.

## 2013-09-16 ENCOUNTER — Inpatient Hospital Stay (HOSPITAL_COMMUNITY): Payer: 59

## 2013-09-16 ENCOUNTER — Encounter (HOSPITAL_COMMUNITY): Payer: Self-pay | Admitting: Emergency Medicine

## 2013-09-16 ENCOUNTER — Inpatient Hospital Stay (HOSPITAL_COMMUNITY)
Admission: EM | Admit: 2013-09-16 | Discharge: 2013-09-20 | DRG: 435 | Disposition: A | Discharge status: Hospice | Payer: 59 | Attending: Family Medicine | Admitting: Family Medicine

## 2013-09-16 ENCOUNTER — Telehealth (INDEPENDENT_AMBULATORY_CARE_PROVIDER_SITE_OTHER): Payer: Self-pay | Admitting: Internal Medicine

## 2013-09-16 DIAGNOSIS — E785 Hyperlipidemia, unspecified: Secondary | ICD-10-CM | POA: Diagnosis present

## 2013-09-16 DIAGNOSIS — C801 Malignant (primary) neoplasm, unspecified: Secondary | ICD-10-CM | POA: Diagnosis present

## 2013-09-16 DIAGNOSIS — D696 Thrombocytopenia, unspecified: Secondary | ICD-10-CM

## 2013-09-16 DIAGNOSIS — Z7401 Bed confinement status: Secondary | ICD-10-CM

## 2013-09-16 DIAGNOSIS — E876 Hypokalemia: Secondary | ICD-10-CM | POA: Diagnosis present

## 2013-09-16 DIAGNOSIS — G9340 Encephalopathy, unspecified: Secondary | ICD-10-CM | POA: Diagnosis present

## 2013-09-16 DIAGNOSIS — G4730 Sleep apnea, unspecified: Secondary | ICD-10-CM | POA: Diagnosis present

## 2013-09-16 DIAGNOSIS — Z801 Family history of malignant neoplasm of trachea, bronchus and lung: Secondary | ICD-10-CM

## 2013-09-16 DIAGNOSIS — K769 Liver disease, unspecified: Secondary | ICD-10-CM

## 2013-09-16 DIAGNOSIS — R142 Eructation: Secondary | ICD-10-CM

## 2013-09-16 DIAGNOSIS — K7689 Other specified diseases of liver: Secondary | ICD-10-CM

## 2013-09-16 DIAGNOSIS — F32A Depression, unspecified: Secondary | ICD-10-CM

## 2013-09-16 DIAGNOSIS — R7401 Elevation of levels of liver transaminase levels: Secondary | ICD-10-CM

## 2013-09-16 DIAGNOSIS — E7800 Pure hypercholesterolemia, unspecified: Secondary | ICD-10-CM

## 2013-09-16 DIAGNOSIS — D6959 Other secondary thrombocytopenia: Secondary | ICD-10-CM | POA: Diagnosis present

## 2013-09-16 DIAGNOSIS — R17 Unspecified jaundice: Secondary | ICD-10-CM

## 2013-09-16 DIAGNOSIS — C7949 Secondary malignant neoplasm of other parts of nervous system: Secondary | ICD-10-CM

## 2013-09-16 DIAGNOSIS — R141 Gas pain: Secondary | ICD-10-CM

## 2013-09-16 DIAGNOSIS — F17200 Nicotine dependence, unspecified, uncomplicated: Secondary | ICD-10-CM | POA: Diagnosis present

## 2013-09-16 DIAGNOSIS — K219 Gastro-esophageal reflux disease without esophagitis: Secondary | ICD-10-CM | POA: Diagnosis present

## 2013-09-16 DIAGNOSIS — R188 Other ascites: Secondary | ICD-10-CM

## 2013-09-16 DIAGNOSIS — R7402 Elevation of levels of lactic acid dehydrogenase (LDH): Secondary | ICD-10-CM | POA: Diagnosis present

## 2013-09-16 DIAGNOSIS — R143 Flatulence: Secondary | ICD-10-CM

## 2013-09-16 DIAGNOSIS — I10 Essential (primary) hypertension: Secondary | ICD-10-CM

## 2013-09-16 DIAGNOSIS — D72829 Elevated white blood cell count, unspecified: Secondary | ICD-10-CM | POA: Diagnosis present

## 2013-09-16 DIAGNOSIS — C3400 Malignant neoplasm of unspecified main bronchus: Secondary | ICD-10-CM | POA: Diagnosis present

## 2013-09-16 DIAGNOSIS — F329 Major depressive disorder, single episode, unspecified: Secondary | ICD-10-CM

## 2013-09-16 DIAGNOSIS — R740 Nonspecific elevation of levels of transaminase and lactic acid dehydrogenase [LDH]: Secondary | ICD-10-CM

## 2013-09-16 DIAGNOSIS — C7931 Secondary malignant neoplasm of brain: Secondary | ICD-10-CM | POA: Diagnosis present

## 2013-09-16 DIAGNOSIS — R140 Abdominal distension (gaseous): Secondary | ICD-10-CM | POA: Diagnosis present

## 2013-09-16 DIAGNOSIS — R52 Pain, unspecified: Secondary | ICD-10-CM

## 2013-09-16 DIAGNOSIS — C787 Secondary malignant neoplasm of liver and intrahepatic bile duct: Principal | ICD-10-CM | POA: Diagnosis present

## 2013-09-16 HISTORY — DX: Malignant (primary) neoplasm, unspecified: C80.1

## 2013-09-16 LAB — CBC WITH DIFFERENTIAL/PLATELET
BASOS PCT: 0 % (ref 0–1)
Basophils Absolute: 0 10*3/uL (ref 0.0–0.1)
EOS ABS: 0 10*3/uL (ref 0.0–0.7)
Eosinophils Relative: 0 % (ref 0–5)
HCT: 40.4 % (ref 36.0–46.0)
HEMOGLOBIN: 13.4 g/dL (ref 12.0–15.0)
Lymphocytes Relative: 11 % — ABNORMAL LOW (ref 12–46)
Lymphs Abs: 1.5 10*3/uL (ref 0.7–4.0)
MCH: 31 pg (ref 26.0–34.0)
MCHC: 33.2 g/dL (ref 30.0–36.0)
MCV: 93.5 fL (ref 78.0–100.0)
MONOS PCT: 4 % (ref 3–12)
Monocytes Absolute: 0.5 10*3/uL (ref 0.1–1.0)
NEUTROS PCT: 84 % — AB (ref 43–77)
Neutro Abs: 11.1 10*3/uL — ABNORMAL HIGH (ref 1.7–7.7)
Platelets: 65 10*3/uL — ABNORMAL LOW (ref 150–400)
RBC: 4.32 MIL/uL (ref 3.87–5.11)
RDW: 14.6 % (ref 11.5–15.5)
WBC: 13.1 10*3/uL — ABNORMAL HIGH (ref 4.0–10.5)

## 2013-09-16 LAB — COMPREHENSIVE METABOLIC PANEL
ALT: 81 U/L — ABNORMAL HIGH (ref 0–35)
AST: 120 U/L — AB (ref 0–37)
Albumin: 3.2 g/dL — ABNORMAL LOW (ref 3.5–5.2)
Alkaline Phosphatase: 227 U/L — ABNORMAL HIGH (ref 39–117)
BILIRUBIN TOTAL: 6.6 mg/dL — AB (ref 0.3–1.2)
BUN: 27 mg/dL — ABNORMAL HIGH (ref 6–23)
CALCIUM: 8.9 mg/dL (ref 8.4–10.5)
CHLORIDE: 91 meq/L — AB (ref 96–112)
CO2: 33 meq/L — AB (ref 19–32)
Creatinine, Ser: 0.96 mg/dL (ref 0.50–1.10)
GFR calc Af Amer: 77 mL/min — ABNORMAL LOW (ref >90)
GFR, EST NON AFRICAN AMERICAN: 66 mL/min — AB (ref >90)
Glucose, Bld: 100 mg/dL — ABNORMAL HIGH (ref 70–99)
Potassium: 3.2 mEq/L — ABNORMAL LOW (ref 3.7–5.3)
SODIUM: 140 meq/L (ref 137–147)
Total Protein: 6.2 g/dL (ref 6.0–8.3)

## 2013-09-16 LAB — URINALYSIS, ROUTINE W REFLEX MICROSCOPIC
Glucose, UA: NEGATIVE mg/dL
HGB URINE DIPSTICK: NEGATIVE
Leukocytes, UA: NEGATIVE
Nitrite: NEGATIVE
PH: 5.5 (ref 5.0–8.0)
Protein, ur: NEGATIVE mg/dL
SPECIFIC GRAVITY, URINE: 1.025 (ref 1.005–1.030)
Urobilinogen, UA: 0.2 mg/dL (ref 0.0–1.0)

## 2013-09-16 LAB — URINE CULTURE: Colony Count: 6000

## 2013-09-16 LAB — PROTIME-INR
INR: 1.22 (ref 0.00–1.49)
Prothrombin Time: 15.1 seconds (ref 11.6–15.2)

## 2013-09-16 LAB — MRSA PCR SCREENING: MRSA by PCR: NEGATIVE

## 2013-09-16 LAB — MAGNESIUM: Magnesium: 2.7 mg/dL — ABNORMAL HIGH (ref 1.5–2.5)

## 2013-09-16 LAB — LACTATE DEHYDROGENASE: LDH: 1271 U/L — AB (ref 94–250)

## 2013-09-16 MED ORDER — LORAZEPAM 0.5 MG PO TABS
1.0000 mg | ORAL_TABLET | Freq: Three times a day (TID) | ORAL | Status: DC | PRN
  Administered 2013-09-16 – 2013-09-17 (×3): 1 mg via ORAL
  Filled 2013-09-16 (×3): qty 2

## 2013-09-16 MED ORDER — SODIUM CHLORIDE 0.9 % IV BOLUS (SEPSIS)
250.0000 mL | Freq: Once | INTRAVENOUS | Status: AC
  Administered 2013-09-16: 250 mL via INTRAVENOUS

## 2013-09-16 MED ORDER — SODIUM CHLORIDE 0.9 % IV SOLN: INTRAVENOUS | Status: AC

## 2013-09-16 MED ORDER — GADOBENATE DIMEGLUMINE 529 MG/ML IV SOLN
13.0000 mL | Freq: Once | INTRAVENOUS | Status: AC | PRN
  Administered 2013-09-16: 13 mL via INTRAVENOUS

## 2013-09-16 MED ORDER — PNEUMOCOCCAL VAC POLYVALENT 25 MCG/0.5ML IJ INJ
0.5000 mL | INJECTION | INTRAMUSCULAR | Status: AC
  Administered 2013-09-17: 0.5 mL via INTRAMUSCULAR
  Filled 2013-09-16: qty 0.5

## 2013-09-16 MED ORDER — ACETAMINOPHEN 650 MG RE SUPP: 650.0000 mg | Freq: Four times a day (QID) | RECTAL | Status: DC | PRN

## 2013-09-16 MED ORDER — ACETAMINOPHEN 325 MG PO TABS: 650.0000 mg | ORAL_TABLET | Freq: Four times a day (QID) | ORAL | Status: DC | PRN

## 2013-09-16 MED ORDER — ONDANSETRON HCL 4 MG/2ML IJ SOLN: 4.0000 mg | Freq: Four times a day (QID) | INTRAMUSCULAR | Status: DC | PRN

## 2013-09-16 MED ORDER — HYDROCODONE-ACETAMINOPHEN 5-325 MG PO TABS
1.0000 | ORAL_TABLET | ORAL | Status: DC | PRN
  Administered 2013-09-16 – 2013-09-17 (×2): 1 via ORAL
  Filled 2013-09-16: qty 1
  Filled 2013-09-16: qty 2
  Filled 2013-09-16: qty 1

## 2013-09-16 MED ORDER — ONDANSETRON HCL 4 MG PO TABS: 4.0000 mg | ORAL_TABLET | Freq: Four times a day (QID) | ORAL | Status: DC | PRN

## 2013-09-16 MED ORDER — LORAZEPAM 2 MG/ML IJ SOLN
1.0000 mg | Freq: Once | INTRAMUSCULAR | Status: AC
  Administered 2013-09-16: 1 mg via INTRAVENOUS
  Filled 2013-09-16: qty 1

## 2013-09-16 MED ORDER — PANTOPRAZOLE SODIUM 40 MG IV SOLR
40.0000 mg | Freq: Two times a day (BID) | INTRAVENOUS | Status: DC
  Administered 2013-09-16 – 2013-09-19 (×8): 40 mg via INTRAVENOUS
  Filled 2013-09-16 (×8): qty 40

## 2013-09-16 MED ORDER — ALUM & MAG HYDROXIDE-SIMETH 200-200-20 MG/5ML PO SUSP: 30.0000 mL | Freq: Four times a day (QID) | ORAL | Status: DC | PRN

## 2013-09-16 MED ORDER — LEVOFLOXACIN IN D5W 750 MG/150ML IV SOLN
750.0000 mg | INTRAVENOUS | Status: DC
  Administered 2013-09-16: 750 mg via INTRAVENOUS
  Filled 2013-09-16: qty 150

## 2013-09-16 MED ORDER — SODIUM CHLORIDE 0.9 % IV SOLN
INTRAVENOUS | Status: DC
Start: 2013-09-16 — End: 2013-09-16
  Administered 2013-09-16: 14:00:00 via INTRAVENOUS

## 2013-09-16 MED ORDER — POTASSIUM CHLORIDE CRYS ER 20 MEQ PO TBCR
40.0000 meq | EXTENDED_RELEASE_TABLET | Freq: Once | ORAL | Status: AC
  Administered 2013-09-16: 40 meq via ORAL
  Filled 2013-09-16: qty 2

## 2013-09-16 NOTE — Progress Notes (Signed)
ANTIBIOTIC CONSULT NOTE - INITIAL  Pharmacy Consult for Levaquin Indication: intra-abdominal infection  No Known Allergies  Patient Measurements: Height: 5\' 6"  (167.6 cm) Weight: 147 lb 11.3 oz (67 kg) IBW/kg (Calculated) : 59.3  Vital Signs: Temp: 98.4 F (36.9 C) (06/08 0944) Temp src: Oral (06/08 0944) BP: 96/53 mmHg (06/08 1130) Pulse Rate: 70 (06/08 1130) Intake/Output from previous day:   Intake/Output from this shift:    Labs:  Recent Labs  09/16/13 1029  WBC 13.1*  HGB 13.4  PLT 65*  CREATININE 0.96   Estimated Creatinine Clearance: 63.4 ml/min (by C-G formula based on Cr of 0.96). No results found for this basename: VANCOTROUGH, VANCOPEAK, VANCORANDOM, GENTTROUGH, GENTPEAK, GENTRANDOM, TOBRATROUGH, TOBRAPEAK, TOBRARND, AMIKACINPEAK, AMIKACINTROU, AMIKACIN,  in the last 72 hours   Microbiology: No results found for this or any previous visit (from the past 720 hour(s)).  Medical History: Past Medical History  Diagnosis Date  . Hypertension   . Hyperlipemia   . Depression   . Sleep apnea   . GERD (gastroesophageal reflux disease)   . [id number](817.7)    Assessment: 53yo female admitted with worsening abdominal distention.  Asked to begin Levaquin for suspected infection.  Pt has good renal fxn.  Estimated Creatinine Clearance: 63.4 ml/min (by C-G formula based on Cr of 0.96).  Goal of Therapy:  Eradicate infection.  Plan:  Levaquin 750mg  IV q24hrs Switch to PO when improved / indicated Monitor labs, renal fxn, progress, and cultures  Maritssa Haughton A Skippy [place] 09/16/2013,1:43 PM

## 2013-09-16 NOTE — Telephone Encounter (Signed)
Results given to husband.

## 2013-09-16 NOTE — Progress Notes (Addendum)
Subjective: Since I last evaluated the patient  Recently seen in our office 09/05/2013 with c/o dysphagia. Foods were very slow to go down. Liquids were also slow to go down. She also c/o bloating and tenderness to her epigastric region. She had had symptoms x 2 weeks before presenting to office. On exam noted to have enlarged liver with tenderness.  Area to epigastric region felt firm. CT at time of OV was ordered. CT ordered  which revealed IMPRESSION:  Very heterogeneous appearance of the liver and cirrhotic changes.  The heterogeneous appearance is concerning for potential underlying  multifocal hepatic lesions. Some of these areas may relate to  irregular fat deposition. Recommend further workup with MRI of the  abdomen pre and post gadolinium administration. Correlation  suggested with liver function tests and AFP level. Formal  GI/hepatology workup would be indicated. There is associated small  amount of ascites. This morning I spoke with her husband. He stated his wife has not eaten since last Thursday. She was lethargic and her abdomen was distended. I advised him to take Wyvonnia Lora to the ED.  Today, before admission, MRI abdomen had been ordered.      Hepatitis B and C were negative. AFP normal. PT/INR normal. Recent Cmet revealed a climbing bilirubin. Today, a paracentesis attempted, but there was not enough fluid. Weight has remained the same.      10/12/2011 Colonoscopy Dr. Laural Golden: screening:  Impression:  Examination performed to cecum.  Noncompliant sigmoid colon.  No evidence of colonic polyps.  Small external hemorrhoids.   Intake/Output from previous day:   Intake/Output this shift: Total I/O In: 150 [IV Piggyback:150] Out: -   General appearance: alert  Lab Results:  Recent Labs  09/16/13 1029  WBC 13.1*  HGB 13.4  HCT 40.4  PLT 65*   BMET  Recent Labs  09/16/13 1029  NA 140  K 3.2*  CL 91*  CO2 33*  GLUCOSE 100*  BUN 27*  CREATININE 0.96   CALCIUM 8.9   LFT  Recent Labs  09/16/13 1029  PROT 6.2  ALBUMIN 3.2*  AST 120*  ALT 81*  ALKPHOS 227*  BILITOT 6.6*   PT/INR  Recent Labs  09/16/13 1035  LABPROT 15.1  INR 1.22   Hepatitis Panel No results found for this basename: HEPBSAG, HCVAB, HEPAIGM, HEPBIGM,  in the last 72 hours C-Diff No results found for this basename: CDIFFTOX,  in the last 72 hours Fecal Lactopherrin No results found for this basename: FECLLACTOFRN,  in the last 72 hours  Studies/Results: Dg Chest Port 1 View  09/16/2013   CLINICAL DATA:  53 year old female with leukocytosis. Initial encounter.  EXAM: PORTABLE CHEST - 1 VIEW  COMPARISON:  CT Abdomen and Pelvis 09/11/2013.  FINDINGS: Portable AP upright view at 1418 hrs. Mildly lordotic view. Normal cardiac size and mediastinal contours. Visualized tracheal air column is within normal limits. Allowing for portable technique, the lungs are clear. No pneumothorax or pleural effusion identified.  IMPRESSION: No acute cardiopulmonary abnormality.   Electronically Signed   By: Lars Pinks M.D.   On: 09/16/2013 14:25    Medications: I have reviewed the patient's current medications.  Objective: Vital signs in last 24 hours: Temp:  [98.4 F (36.9 C)] 98.4 F (36.9 C) (06/08 0944) Pulse Rate:  [70-78] 70 (06/08 1130) Resp:  [18] 18 (06/08 0944) BP: (91-103)/(50-64) 96/53 mmHg (06/08 1130) SpO2:  [93 %-99 %] 94 % (06/08 1130) Weight:  [147 lb 11.3 oz (67 kg)] 147 lb  11.3 oz (67 kg) (06/08 1332)  Alert, Eye slightly icteric.Lungs: bilateral wheezes. HR regular.  Liver 6-7 fingerbreadth below rib case. BS positive.  Abdomen distended. Tenderness epigastric region. No edema to extremities.  Assessment/Plan: Hepatic lesions, abdominal distention, elevated liver enzymes. Liver carcinoma needs to be ruled out. MRI scheduled.    LOS: 0 days   Butch Penny 09/16/2013, 3:39 PM  GI attending note;patient interviewed and examined. Patient has  hepatomegaly with multiple filling defects concerning for neoplasm. AFP is normal. MRI of liver has been ordered. She possibly will need liver biopsy. Lorazepam ordered.

## 2013-09-16 NOTE — H&P (Signed)
[facility] History and Physical  [name] VQQ:[id number] DOB: 12-19-1960 DOA: 09/16/2013  Referring physician:  PCP: Dear, Trude Mcburney, MD   Chief Complaint:   HPI: [name] is a 53 y.o. female with a past medical history that includes hypertension, GERD, hyperlipidemia presents to the emergency department with the chief complaint of worsening abdominal distention. She reports that about 2 weeks ago she developed constipation and worsening reflux. She went to gastroenterology outpatient. She had a CT which there was concern about lesions in the liver biopsy is in the process of being scheduled. Since that time the abdominal distention has worsened. 2 days ago she came to the emergency for worsening abdominal distention. Associated symptoms include generalized weakness decreased by mouth intake and intermittent diarrhea. She denies any fever chills chest pain palpitation, lower extremity edema,  shortness of breath headache visual disturbances syncope or near-syncope. She states that lying flat makes it worse and she's had to sleep in a chair. She denies any abdominal pain she describes it as a "pressure". She denies history of EtOH use and denies history of IV drug use. Initial workup in the emergency department yields a complete metabolic panel yielding mild hypokalemia, elevated transaminases, leukocytosis. She is afebrile. Her blood pressure is mildly soft otherwise her vital signs are stable. She is not hypoxic  Review of Systems:  10 point review of systems complete and all systems are negative except as indicated in the history of present illness   Past Medical History  Diagnosis Date  . Hypertension   . Hyperlipemia   . Depression   . Sleep apnea   . GERD (gastroesophageal reflux disease)   . EPPIRJJO(841.6)    Past Surgical History  Procedure Laterality Date  . Wisdom tooth extraction    . Dilation and curettage of uterus      1986  . Colonoscopy  10/12/2011   Procedure: COLONOSCOPY;  Surgeon: Rogene Houston, MD;  Location: [facility];  Service: Endoscopy;  Laterality: N/A;  1030   Social History:  reports that she has been smoking.  She does not have any smokeless tobacco history on file. She reports that she does not drink alcohol or use illicit drugs.  No Known Allergies  Family History  Problem Relation Age of Onset  . COPD Mother   . COPD Father    she has 3 sisters and one brother who are in good health. Mother died complications of lung cancer. Father deceased from COPD  Prior to Admission medications   Medication Sig Start Date End Date Taking? Authorizing Provider  alum & mag hydroxide-simeth (MAALOX/MYLANTA) 200-200-20 MG/5ML suspension Take 15 mLs by mouth every 6 (six) hours as needed for indigestion or heartburn.   Yes Historical Provider, MD  citalopram (CELEXA) 10 MG tablet Take 10 mg by mouth daily.   Yes Historical Provider, MD  esomeprazole (NEXIUM) 40 MG capsule Take 40 mg by mouth daily.   Yes Historical Provider, MD  ibuprofen (ADVIL,MOTRIN) 200 MG tablet Take 400 mg by mouth daily as needed for headache.   Yes Historical Provider, MD  lisinopril (PRINIVIL,ZESTRIL) 10 MG tablet Take 5 mg by mouth every evening.    Yes Historical Provider, MD  promethazine (PHENERGAN) 12.5 MG tablet Take 12.5 mg by mouth every 6 (six) hours as needed. nausea 06/30/13  Yes Historical Provider, MD  simvastatin (ZOCOR) 20 MG tablet Take 20 mg by mouth every evening.   Yes Historical Provider, MD  spironolactone (ALDACTONE) 25 MG  tablet Take 1 tablet (25 mg total) by mouth daily. 09/14/13  Yes Richarda Blade, MD  traZODone (DESYREL) 100 MG tablet Take 50-100 mg by mouth at bedtime. 08/25/13  Yes Historical Provider, MD  Vitamin D, Ergocalciferol, (DRISDOL) 50000 UNITS CAPS Take 50,000 Units by mouth every 7 (seven) days. On wednesday   Yes Historical Provider, MD   Physical Exam: Filed Vitals:   09/16/13 1130  BP: 96/53  Pulse: 70  Temp:     Resp:     BP 96/53  Pulse 70  Temp(Src) 98.4 F (36.9 C) (Oral)  Resp 18  SpO2 94%  LMP 08/14/2011  General:  Well-nourished calm cooperative no acute distress Eyes: PERRL, EOMI ENT: Ears clear nose without drainage oropharynx without erythema or exudate. Mucous membranes of her mouth are pink slightly dry Neck: no LAD, masses or thyromegaly Cardiovascular: RRR, no m/r/g. No LE edema. Pulses present and palpable Telemetry: SR, no arrhythmias  Respiratory: CTA bilaterally, no w/r/r. Normal respiratory effort. Abdomen: Fairly distended but mostly soft. Sluggish bowel sounds particularly in the upper quadrants. Moderate tenderness to palpation in upper right hand quadrant. Guarding no rebound Skin: no rash. Slightly orangish Musculoskeletal: grossly normal tone BUE/BLE Psychiatric: grossly normal mood and affect, speech fluent and appropriate Neurologic: grossly non-focal.          Labs on Admission:  Basic Metabolic Panel:  Recent Labs Lab 09/11/13 1112 09/16/13 1029  NA 144 140  K 3.3* 3.2*  CL 99 91*  CO2 33* 33*  GLUCOSE 85 100*  BUN 24* 27*  CREATININE 1.01 0.96  CALCIUM 8.7 8.9   Liver Function Tests:  Recent Labs Lab 09/11/13 1112 09/16/13 1029  AST 73* 120*  ALT 63* 81*  ALKPHOS 161* 227*  BILITOT 1.4* 6.6*  PROT 5.6* 6.2  ALBUMIN 3.4* 3.2*   No results found for this basename: LIPASE, AMYLASE,  in the last 168 hours No results found for this basename: AMMONIA,  in the last 168 hours CBC:  Recent Labs Lab 09/11/13 1112 09/16/13 1029  WBC 13.7* 13.1*  NEUTROABS 11.2* 11.1*  HGB 14.2 13.4  HCT 41.6 40.4  MCV 90.0 93.5  PLT 109* 65*   Cardiac Enzymes: No results found for this basename: CKTOTAL, CKMB, CKMBINDEX, TROPONINI,  in the last 168 hours  BNP (last 3 results) No results found for this basename: PROBNP,  in the last 8760 hours CBG: No results found for this basename: GLUCAP,  in the last 168 hours  Radiological Exams on  Admission: No results found.  EKG: I  Assessment/Plan Principal Problem:   Abdominal distension: Etiology unclear. CT done 2 days ago reveals cirrhotic liver with questionable lesions as well as perihepatic fluid. Since that time her distention has worsened and today's labs indicate her transaminase levels have worsened. she is afebrile and nontoxic appearing but does have a leukocytosis and somewhat soft blood pressure. We will admit her to the step down unit. We will request abdominal ultrasound as well as paracentesis per interventional radiology. Will obtain cytology, cell count, LDH, Gram stain and culture, protein levels on this fluid. She denies EtOH and IV drug use.  ?lesions per CT concerning for possible malignancy as patient is a lifelong smoker. Hep B surface negative HCV ab negative per chart review. She was seen last week by Dr. Laural Golden and liver biopsy in process of being scheduled. Will request GI consult. Will defer any further imaging to GI at this time. Active Problems: Elevated transaminase level: See #  1. CT yields cirrhotic like  liver. Hep B and C Negative. No history of EtOH or IV drug use. CT also with lesions concerning for malignancy. Chart review indicates that a liver biopsy is in the process of being scheduled per GI office. Of note her total bilirubin went from 1.4-6.6 in 2 days.  Ascites: CT 2 days ago with mild. Hepatic fluid. Worsening distention since then. Will obtain abdominal ultrasound. Will request paracentesis via interventional radiology. Will request GI consult. See #1  Leukocytosis: Currently she is afebrile and nontoxic appearing. See #1. Will obtain chest x-ray, urinalysis with culture, blood culture.  Will start empiric Levaquin per pharmacy as some concern for SBP. Will monitor closely    Hypokalemia: Mild. Likely due to decreased by mouth intake. Will replete and recheck in the a.m.    Thrombocytopenia: Likely related to liver function no signs symptoms  of bleeding. Will monitor    Essential hypertension, benign: Blood pressure somewhat soft while in the emergency department. I will hold her lisinopril and her spironolactone for now.    High cholesterol: We'll hold her statin for now     Code Status: full Family Communication: husband at bedside Disposition Plan: home when ready  Time spent: 94 minutes  [facility] Pager [phone]  **Disclaimer: This note may have been dictated with voice recognition software. Similar sounding words can inadvertently be transcribed and this note may contain transcription errors which may not have been corrected upon publication of note.**

## 2013-09-16 NOTE — Telephone Encounter (Signed)
Patient is going to the ED

## 2013-09-16 NOTE — ED Provider Notes (Signed)
CSN: [id number]     Arrival date & time 09/16/13  0935 History   First MD Initiated Contact with Patient 09/16/13 [phone]     Chief Complaint  Patient presents with  . Abdominal Pain     (Consider location/radiation/quality/duration/timing/severity/associated sxs/prior Treatment) HPI Comments: [name] is a 53 y.o. Female presenting for re-evaluation of abdominal distention associated with new onset ascites which has been progressive over the past 2 weeks.  She is currently being evaluated for this by Dr. Laural Golden , had a CT scan in May showing nonspecific liver lesions and is anticipating an MRI for further evaluation.  She was seen here 2 days ago, but now has increased abdominal distention,  Denies pain but feels very full and bloated and has had decreased appetite, has not eaten in 2 days and has had very little po fluid intake.  She is more uncomfortable when lying flat, therefore has tried to sleep in a chair the past 2 nights causing increased fatigue.  She denies vomiting, fevers, diarrhea. She denies etoh use and denies history of IVDU.  Per resulted recent labs,  Her hepatitis panel is negative for C and D.      The history is provided by the patient, the spouse and a relative.    Past Medical History  Diagnosis Date  . Hypertension   . Hyperlipemia   . Depression   . Sleep apnea   . GERD (gastroesophageal reflux disease)   . WIOXBDZH(299.2)    Past Surgical History  Procedure Laterality Date  . Wisdom tooth extraction    . Dilation and curettage of uterus      1986  . Colonoscopy  10/12/2011    Procedure: COLONOSCOPY;  Surgeon: Rogene Houston, MD;  Location: [facility];  Service: Endoscopy;  Laterality: N/A;  1030   Family History  Problem Relation Age of Onset  . COPD Mother   . COPD Father    History  Substance Use Topics  . Smoking status: Current Every Day Smoker -- 1.00 packs/day for 30 years  . Smokeless tobacco: Not on file     Comment: a pack or more  a day x 30 yrs.  . Alcohol Use: No   OB History   Grav Para Term Preterm Abortions TAB SAB Ect Mult Living                 Review of Systems  Constitutional: Positive for appetite change and fatigue. Negative for fever.  HENT: Negative for congestion and sore throat.   Eyes: Negative.   Respiratory: Negative for chest tightness and shortness of breath.   Cardiovascular: Negative for chest pain.  Gastrointestinal: Positive for abdominal distention. Negative for nausea, vomiting and abdominal pain.  Genitourinary: Negative.   Musculoskeletal: Negative for arthralgias, joint swelling and neck pain.  Skin: Negative.  Negative for color change, rash and wound.  Neurological: Positive for weakness. Negative for dizziness, light-headedness, numbness and headaches.  Psychiatric/Behavioral: Negative.       Allergies  Review of patient's allergies indicates no known allergies.  Home Medications   Prior to Admission medications   Medication Sig Start Date End Date Taking? Authorizing Provider  alum & mag hydroxide-simeth (MAALOX/MYLANTA) 200-200-20 MG/5ML suspension Take 15 mLs by mouth every 6 (six) hours as needed for indigestion or heartburn.   Yes Historical Provider, MD  citalopram (CELEXA) 10 MG tablet Take 10 mg by mouth daily.   Yes Historical Provider, MD  esomeprazole (NEXIUM) 40 MG capsule  Take 40 mg by mouth daily.   Yes Historical Provider, MD  ibuprofen (ADVIL,MOTRIN) 200 MG tablet Take 400 mg by mouth daily as needed for headache.   Yes Historical Provider, MD  lisinopril (PRINIVIL,ZESTRIL) 10 MG tablet Take 5 mg by mouth every evening.    Yes Historical Provider, MD  promethazine (PHENERGAN) 12.5 MG tablet Take 12.5 mg by mouth every 6 (six) hours as needed. nausea 06/30/13  Yes Historical Provider, MD  simvastatin (ZOCOR) 20 MG tablet Take 20 mg by mouth every evening.   Yes Historical Provider, MD  spironolactone (ALDACTONE) 25 MG tablet Take 1 tablet (25 mg total) by  mouth daily. 09/14/13  Yes Richarda Blade, MD  traZODone (DESYREL) 100 MG tablet Take 50-100 mg by mouth at bedtime. 08/25/13  Yes Historical Provider, MD  Vitamin D, Ergocalciferol, (DRISDOL) 50000 UNITS CAPS Take 50,000 Units by mouth every 7 (seven) days. On wednesday   Yes Historical Provider, MD   BP 96/53  Pulse 70  Temp(Src) 98.4 F (36.9 C) (Oral)  Resp 18  Ht 5\' 6"  (1.676 m)  Wt 147 lb 11.3 oz (67 kg)  BMI 23.85 kg/m2  SpO2 94%  LMP 08/14/2011 Physical Exam  Nursing note and vitals reviewed. Constitutional: She appears well-developed and well-nourished.  HENT:  Head: Normocephalic and atraumatic.  Mouth/Throat: Mucous membranes are dry.  Eyes: Conjunctivae are normal. No scleral icterus.  Neck: Normal range of motion.  Cardiovascular: Normal rate, regular rhythm, normal heart sounds and intact distal pulses.   Pulmonary/Chest: Effort normal and breath sounds normal. She has no wheezes.  Abdominal: Bowel sounds are normal. She exhibits distension and mass. There is no tenderness. There is no guarding.  Upper midline mass.  Musculoskeletal: Normal range of motion.  Neurological: She is alert.  Skin: Skin is warm and dry.  Possible subtle early jaundice.  Psychiatric: She has a normal mood and affect.    ED Course  Procedures (including critical care time) Labs Review Labs Reviewed  COMPREHENSIVE METABOLIC PANEL - Abnormal; Notable for the following:    Potassium 3.2 (*)    Chloride 91 (*)    CO2 33 (*)    Glucose, Bld 100 (*)    BUN 27 (*)    Albumin 3.2 (*)    AST 120 (*)    ALT 81 (*)    Alkaline Phosphatase 227 (*)    Total Bilirubin 6.6 (*)    GFR calc non Af Amer 66 (*)    GFR calc Af Amer 77 (*)    All other components within normal limits  CBC WITH DIFFERENTIAL - Abnormal; Notable for the following:    WBC 13.1 (*)    Platelets 65 (*)    Neutrophils Relative % 84 (*)    Neutro Abs 11.1 (*)    Lymphocytes Relative 11 (*)    All other components  within normal limits  MAGNESIUM - Abnormal; Notable for the following:    Magnesium 2.7 (*)    All other components within normal limits  LACTATE DEHYDROGENASE - Abnormal; Notable for the following:    LDH 1271 (*)    All other components within normal limits  URINE CULTURE  CULTURE, BLOOD (ROUTINE X 2)  CULTURE, BLOOD (ROUTINE X 2)  BODY FLUID CULTURE  MRSA PCR SCREENING  PROTIME-INR  AFP TUMOR MARKER  URINALYSIS, ROUTINE W REFLEX MICROSCOPIC  BODY FLUID CELL COUNT WITH DIFFERENTIAL  LACTATE DEHYDROGENASE, BODY FLUID  PROTEIN, BODY FLUID  CYTOLOGY - NON PAP  Imaging Review US Abdomen Limited  09/16/2013   CLINICAL DATA:  Ascites.  Possible paracentesis.  EXAM: LIMITED ABDOMEN ULTRASOUND FOR ASCITES  TECHNIQUE: Limited ultrasound survey for ascites was performed in all four abdominal quadrants.  COMPARISON:  CT abdomen pelvis 09/11/2013.  FINDINGS: There is a small amount of fluid in the right upper quadrant. Minimal fluid in the lower quadrants bilaterally.  IMPRESSION: Small ascites.  Paracentesis not performed.   Electronically Signed   By: Lorin Picket M.D.   On: 09/16/2013 16:04   Dg Chest Port 1 View  09/16/2013   CLINICAL DATA:  53 year old female with leukocytosis. Initial encounter.  EXAM: PORTABLE CHEST - 1 VIEW  COMPARISON:  CT Abdomen and Pelvis 09/11/2013.  FINDINGS: Portable AP upright view at 1418 hrs. Mildly lordotic view. Normal cardiac size and mediastinal contours. Visualized tracheal air column is within normal limits. Allowing for portable technique, the lungs are clear. No pneumothorax or pleural effusion identified.  IMPRESSION: No acute cardiopulmonary abnormality.   Electronically Signed   By: Lars Pinks M.D.   On: 09/16/2013 14:25     EKG Interpretation None      MDM   Final diagnoses:  Ascites  Hyperbilirubinemia    Patients labs and/or radiological studies were viewed and considered during the medical decision making and disposition process. Pt  discussed with edp who also saw pt.  Call placed to [facility] group who accepts pt for admission.    Evalee Jefferson, PA-C 09/16/13 [phone]

## 2013-09-16 NOTE — Telephone Encounter (Signed)
Per Karna Christmas, patient has been admitted and she will have MRI as inpatient

## 2013-09-16 NOTE — H&P (Addendum)
I have seen and assessed patient and reviewed pertinent data and labs, and agree with Dyanne Carrel, NP history and physical exam and assessment and plan. Abdominal ultrasound was obtained however due to minimal ascitic fluid, paracentesis could not be attempted. Patient has been seen by gastroenterology and the MRI of the abdomen has been ordered for further evaluation. Will discontinue IV Levaquin for now. Monitor.

## 2013-09-16 NOTE — ED Notes (Signed)
Pt c/o generalized abdominal discomfort and swelling. Pt states "I was here on Saturday and they said I had fluid on my liver". Pt denies n/v but reports diarrhea.

## 2013-09-16 NOTE — ED Provider Notes (Signed)
Patient seen/examined in the Emergency Department in conjunction with Midlevel Provider Idol Patient reports abd pain with distention Exam : awake/alert, abdominal distention, mild diffuse tenderness Plan: recommend admit for further evaluation    Sharyon Cable, MD 09/16/13 1143

## 2013-09-17 ENCOUNTER — Ambulatory Visit (HOSPITAL_COMMUNITY)
Admit: 2013-09-17 | Discharge: 2013-09-17 | Disposition: A | Discharge status: Home / Self Care | Payer: 59 | Attending: Internal Medicine | Admitting: Internal Medicine

## 2013-09-17 ENCOUNTER — Ambulatory Visit (HOSPITAL_COMMUNITY): Payer: 59

## 2013-09-17 ENCOUNTER — Inpatient Hospital Stay (HOSPITAL_COMMUNITY): Payer: 59

## 2013-09-17 DIAGNOSIS — Z801 Family history of malignant neoplasm of trachea, bronchus and lung: Secondary | ICD-10-CM

## 2013-09-17 DIAGNOSIS — C7949 Secondary malignant neoplasm of other parts of nervous system: Secondary | ICD-10-CM

## 2013-09-17 DIAGNOSIS — C787 Secondary malignant neoplasm of liver and intrahepatic bile duct: Secondary | ICD-10-CM

## 2013-09-17 DIAGNOSIS — G9340 Encephalopathy, unspecified: Secondary | ICD-10-CM

## 2013-09-17 DIAGNOSIS — D72829 Elevated white blood cell count, unspecified: Secondary | ICD-10-CM

## 2013-09-17 DIAGNOSIS — C7931 Secondary malignant neoplasm of brain: Secondary | ICD-10-CM

## 2013-09-17 DIAGNOSIS — E876 Hypokalemia: Secondary | ICD-10-CM

## 2013-09-17 DIAGNOSIS — R188 Other ascites: Secondary | ICD-10-CM

## 2013-09-17 LAB — HEPATIC FUNCTION PANEL
ALT: 77 U/L — ABNORMAL HIGH (ref 0–35)
AST: 103 U/L — ABNORMAL HIGH (ref 0–37)
Albumin: 2.7 g/dL — ABNORMAL LOW (ref 3.5–5.2)
Alkaline Phosphatase: 190 U/L — ABNORMAL HIGH (ref 39–117)
BILIRUBIN DIRECT: 4.8 mg/dL — AB (ref 0.0–0.3)
BILIRUBIN INDIRECT: 1.1 mg/dL — AB (ref 0.3–0.9)
Total Bilirubin: 5.9 mg/dL — ABNORMAL HIGH (ref 0.3–1.2)
Total Protein: 5.2 g/dL — ABNORMAL LOW (ref 6.0–8.3)

## 2013-09-17 LAB — CBC
HEMATOCRIT: 37.3 % (ref 36.0–46.0)
Hemoglobin: 12.4 g/dL (ref 12.0–15.0)
MCH: 31.4 pg (ref 26.0–34.0)
MCHC: 33.2 g/dL (ref 30.0–36.0)
MCV: 94.4 fL (ref 78.0–100.0)
Platelets: 57 10*3/uL — ABNORMAL LOW (ref 150–400)
RBC: 3.95 MIL/uL (ref 3.87–5.11)
RDW: 15 % (ref 11.5–15.5)
WBC: 13.8 10*3/uL — ABNORMAL HIGH (ref 4.0–10.5)

## 2013-09-17 LAB — BASIC METABOLIC PANEL
BUN: 28 mg/dL — ABNORMAL HIGH (ref 6–23)
CO2: 29 mEq/L (ref 19–32)
CREATININE: 0.89 mg/dL (ref 0.50–1.10)
Calcium: 8.2 mg/dL — ABNORMAL LOW (ref 8.4–10.5)
Chloride: 94 mEq/L — ABNORMAL LOW (ref 96–112)
GFR, EST AFRICAN AMERICAN: 84 mL/min — AB (ref >90)
GFR, EST NON AFRICAN AMERICAN: 73 mL/min — AB (ref >90)
Glucose, Bld: 114 mg/dL — ABNORMAL HIGH (ref 70–99)
Potassium: 3.1 mEq/L — ABNORMAL LOW (ref 3.7–5.3)
Sodium: 138 mEq/L (ref 137–147)

## 2013-09-17 LAB — AFP TUMOR MARKER: AFP-Tumor Marker: 6.9 ng/mL (ref 0.0–8.0)

## 2013-09-17 LAB — PROTEIN, BODY FLUID: Total protein, fluid: 1.3 g/dL

## 2013-09-17 LAB — BODY FLUID CELL COUNT WITH DIFFERENTIAL
Eos, Fluid: 0 %
Total Nucleated Cell Count, Fluid: 3 cu mm (ref 0–1000)

## 2013-09-17 LAB — GLUCOSE, PERITONEAL FLUID: GLUCOSE, PERITONEAL FLUID: 111 mg/dL

## 2013-09-17 LAB — URINE CULTURE
Colony Count: NO GROWTH
Culture: NO GROWTH

## 2013-09-17 LAB — LACTATE DEHYDROGENASE, PLEURAL OR PERITONEAL FLUID: LD, Fluid: 223 U/L — ABNORMAL HIGH (ref 3–23)

## 2013-09-17 MED ORDER — SODIUM CHLORIDE 0.9 % IJ SOLN
10.0000 mL | INTRAMUSCULAR | Status: DC | PRN
  Administered 2013-09-17: 10 mL via INTRAVENOUS

## 2013-09-17 MED ORDER — OXYCODONE HCL 5 MG PO TABS
ORAL_TABLET | ORAL | Status: AC
  Filled 2013-09-17: qty 1

## 2013-09-17 MED ORDER — MIDAZOLAM HCL 2 MG/2ML IJ SOLN
INTRAMUSCULAR | Status: AC | PRN
  Administered 2013-09-17 (×3): 1 mg via INTRAVENOUS

## 2013-09-17 MED ORDER — OXYCODONE HCL 5 MG PO TABS
5.0000 mg | ORAL_TABLET | Freq: Once | ORAL | Status: AC
  Administered 2013-09-17: 5 mg via ORAL

## 2013-09-17 MED ORDER — FENTANYL CITRATE 0.05 MG/ML IJ SOLN
INTRAMUSCULAR | Status: AC | PRN
  Administered 2013-09-17 (×3): 50 ug via INTRAVENOUS

## 2013-09-17 MED ORDER — POTASSIUM CHLORIDE CRYS ER 20 MEQ PO TBCR
40.0000 meq | EXTENDED_RELEASE_TABLET | ORAL | Status: AC
  Administered 2013-09-17: 40 meq via ORAL
  Filled 2013-09-17: qty 2

## 2013-09-17 MED ORDER — MIDAZOLAM HCL 2 MG/2ML IJ SOLN
INTRAMUSCULAR | Status: AC
  Filled 2013-09-17: qty 4

## 2013-09-17 MED ORDER — FENTANYL CITRATE 0.05 MG/ML IJ SOLN
INTRAMUSCULAR | Status: AC
  Filled 2013-09-17: qty 4

## 2013-09-17 NOTE — ED Provider Notes (Signed)
Medical screening examination/treatment/procedure(s) were conducted as a shared visit with non-physician practitioner(s) and myself.  I personally evaluated the patient during the encounter.   EKG Interpretation None        Sharyon Cable, MD 09/17/13 1046

## 2013-09-17 NOTE — Sedation Documentation (Signed)
[facility] arrived to transport to [facility].

## 2013-09-17 NOTE — Progress Notes (Signed)
I have seen and assessed patient and agree with Dyanne Carrel, NP assessment and plan. Patient presented with abdominal bloating and distention. Patient with prior CT scan with liver lesions. Abdominal ultrasound done with no significant ascites and subsequently paracentesis was not performed. Chest x-ray is negative. Urinalysis is negative. Blood cultures are pending. Discontinued empiric IV Levaquin for now. Patient is currently afebrile. MRI findings as discussed above. ACE level has been ordered. Patient for ultrasound-guided liver biopsy by interventional radiology, Dr. Anselm Pancoast. Gastroenterology following and appreciate input and recommendations.

## 2013-09-17 NOTE — Progress Notes (Signed)
[facility] PROGRESS NOTE  [name] PPI:[id number] DOB: 14-Apr-1960 DOA: 09/16/2013 PCP: Dear, Trude Mcburney, MD  Assessment/Plan: Abdominal distension: Etiology uncertain. MRI yields diffuse abnormality of the liver associated with hepatomegaly, heterogeneous signal and enhancement. Features are nonspecific and not typical for hepatic steatosis.  Per MRI report. findings are most  consistent with an infiltrative/inflammatory process, potentially hepatic inflammatory pseudotumor or sarcoidosis given the associated adenopathy in the right hilum, mediastinum and porta hepatis. However, infiltrative neoplasm cannot be completely excluded. Associated ascites and gallbladder wall edema, attributed to adjacent liver disease/hypoproteinemia. No typical findings of portal hypertension. Dr Laural Golden assistance appreciated. Will go to [facility] today for US-guided liver biopsy by Dr. Anselm Pancoast per Dr. Laural Golden. Not enough fluid for paracentesis.  She denies EtOH and IV drug use.   Active Problems:  Elevated transaminase level: etiology uncertain. Will need tissue for diagnosis.  See #1. Trending down today.  Hep B and C Negative. No history of EtOH or IV drug use.    Ascites: CT 3 days ago with mild. No  Paracentesis due to not enough fluid. Will monitor.   Leukocytosis: She remains afebrile and nontoxic appearing. See #1. Chest x-ray with no acute cardiopulmonary process, urinalysis unremarkable, blood culture no growth to day. Discontinue empiric Levaquin.. Will monitor closely   Hypokalemia: persistent but mild. Likely due to decreased by mouth intake. Replete and recheck in the a.m. Magnesium level 2.7.  Thrombocytopenia: trending downward.  Likely related to liver function no signs symptoms of bleeding. Will monitor   Essential hypertension, benign: Blood pressure remains somewhat soft. Holding lisinopril and her spironolactone for now.   High cholesterol: We'll hold her statin for now  Code Status:  full Family Communication: husband at bedside Disposition Plan: home when ready   Consultants:  Dr Laural Golden  Procedures:    Antibiotics:    HPI/Subjective: Lying in bed eyes open. Denies abdominal pain. Denies nausea.   Objective: Filed Vitals:   09/17/13 0800  BP:   Pulse:   Temp: 98.4 F (36.9 C)  Resp:     Intake/Output Summary (Last 24 hours) at 09/17/13 0947 Last data filed at 09/17/13 0600  Gross per 24 hour  Intake    985 ml  Output    350 ml  Net    635 ml   Filed Weights   09/16/13 1332 09/17/13 0500  Weight: 67 kg (147 lb 11.3 oz) 67 kg (147 lb 11.3 oz)    Exam:   General:  Well nourished appears comfortable  Cardiovascular: RRR No MGR No LE edema  Respiratory: normal effort BS clear bilaterally no wheeze no rhonchi  Abdomen: distended. Firmer this am. Increased tenderness upper right quadrant.  Musculoskeletal: no clubbing no cyanosis  Data Reviewed: Basic Metabolic Panel:  Recent Labs Lab 09/11/13 1112 09/16/13 1029 09/16/13 1035 09/17/13 0446  NA 144 140  --  138  K 3.3* 3.2*  --  3.1*  CL 99 91*  --  94*  CO2 33* 33*  --  29  GLUCOSE 85 100*  --  114*  BUN 24* 27*  --  28*  CREATININE 1.01 0.96  --  0.89  CALCIUM 8.7 8.9  --  8.2*  MG  --   --  2.7*  --    Liver Function Tests:  Recent Labs Lab 09/11/13 1112 09/16/13 1029 09/17/13 0446  AST 73* 120* 103*  ALT 63* 81* 77*  ALKPHOS 161* 227* 190*  BILITOT 1.4* 6.6* 5.9*  PROT 5.6* 6.2  5.2*  ALBUMIN 3.4* 3.2* 2.7*   No results found for this basename: LIPASE, AMYLASE,  in the last 168 hours No results found for this basename: AMMONIA,  in the last 168 hours CBC:  Recent Labs Lab 09/11/13 1112 09/16/13 1029 09/17/13 0446  WBC 13.7* 13.1* 13.8*  NEUTROABS 11.2* 11.1*  --   HGB 14.2 13.4 12.4  HCT 41.6 40.4 37.3  MCV 90.0 93.5 94.4  PLT 109* 65* 57*   Cardiac Enzymes: No results found for this basename: CKTOTAL, CKMB, CKMBINDEX, TROPONINI,  in the last 168  hours BNP (last 3 results) No results found for this basename: PROBNP,  in the last 8760 hours CBG: No results found for this basename: GLUCAP,  in the last 168 hours  Recent Results (from the past 240 hour(s))  URINE CULTURE     Status: None   Collection Time    09/14/13  6:17 PM      Result Value Ref Range Status   Specimen Description URINE, CLEAN CATCH   Final   Special Requests NONE   Final   Culture  Setup Time     Final   Value: 09/15/2013 21:03     Performed at [place]     Final   Value: 6,000 COLONIES/ML     Performed at [facility]   Culture     Final   Value: INSIGNIFICANT GROWTH     Performed at [facility]   Report Status 09/16/2013 FINAL   Final  MRSA PCR SCREENING     Status: None   Collection Time    09/16/13  1:41 PM      Result Value Ref Range Status   MRSA by PCR NEGATIVE  NEGATIVE Final   Comment:            The GeneXpert MRSA Assay (FDA     approved for NASAL specimens     only), is one component of a     comprehensive MRSA colonization     surveillance program. It is not     intended to diagnose MRSA     infection nor to guide or     monitor treatment for     MRSA infections.     Studies: Mr Abdomen W Wo Contrast  09/17/2013   CLINICAL DATA:  Abdominal pain with ascites and liver lesions on CT. No history of malignancy.  EXAM: MRI ABDOMEN WITHOUT AND WITH CONTRAST  TECHNIQUE: Multiplanar multisequence MR imaging of the abdomen was performed both before and after the administration of intravenous contrast.  CONTRAST:  34mL MULTIHANCE GADOBENATE DIMEGLUMINE 529 MG/ML IV SOLN  COMPARISON:  Abdominal CT 09/11/2013. Limited ultrasound 09/16/2013.  FINDINGS: The liver is enlarged, measuring 15.9 x 25.3 x 24.8 cm. As demonstrated on CT, the liver is diffusely heterogeneous in signal. There are multiple areas of heterogeneous T2 hyperintensity and T1 hypo intensity. Some of these are peripheral and wedge-shaped,  although others are rounded. There is associated restricted diffusion. No typical loss of signal on the gradient echo opposed phase imaging is demonstrated to suggest focal fat. Post-contrast, there is some ring enhancement of many of the smaller rounded lesions.  The hepatic and portal veins are patent. Hepatic vessels course through some of the lesions. There is extrinsic mass effect on the hepatic veins and IVC. The left periumbilical vein appears recanalized. No varices are demonstrated. There is a moderate amount of ascites.  The spleen is normal in  size without focal abnormality. Several prominent lymph nodes are present within the porta hepatis. The coronal images also demonstrate right hilar and subcarinal lymphadenopathy.  There is gallbladder wall edema without focal fluid collection or biliary dilatation. The pancreas, adrenal glands and kidneys appear unremarkable. There are probable scattered hemangiomas within the spine.  IMPRESSION: 1. Diffuse abnormality of the liver associated with hepatomegaly, heterogeneous signal and enhancement. Features are nonspecific and not typical for hepatic steatosis. I believe the findings are most consistent with an infiltrative/inflammatory process, potentially hepatic inflammatory pseudotumor or sarcoidosis given the associated adenopathy in the right hilum, mediastinum and porta hepatis. However, infiltrative neoplasm cannot be completely excluded. Tissue sampling recommended. 2. Associated ascites and gallbladder wall edema, attributed to adjacent liver disease/hypoproteinemia. No typical findings of portal hypertension. 3. Findings have been discussed by telephone with Dr. Laural Golden.   Electronically Signed   By: Camie Patience M.D.   On: 09/17/2013 09:11   US Abdomen Limited  09/16/2013   CLINICAL DATA:  Ascites.  Possible paracentesis.  EXAM: LIMITED ABDOMEN ULTRASOUND FOR ASCITES  TECHNIQUE: Limited ultrasound survey for ascites was performed in all four abdominal  quadrants.  COMPARISON:  CT abdomen pelvis 09/11/2013.  FINDINGS: There is a small amount of fluid in the right upper quadrant. Minimal fluid in the lower quadrants bilaterally.  IMPRESSION: Small ascites.  Paracentesis not performed.   Electronically Signed   By: Lorin Picket M.D.   On: 09/16/2013 16:04   Dg Chest Port 1 View  09/16/2013   CLINICAL DATA:  53 year old female with leukocytosis. Initial encounter.  EXAM: PORTABLE CHEST - 1 VIEW  COMPARISON:  CT Abdomen and Pelvis 09/11/2013.  FINDINGS: Portable AP upright view at 1418 hrs. Mildly lordotic view. Normal cardiac size and mediastinal contours. Visualized tracheal air column is within normal limits. Allowing for portable technique, the lungs are clear. No pneumothorax or pleural effusion identified.  IMPRESSION: No acute cardiopulmonary abnormality.   Electronically Signed   By: Lars Pinks M.D.   On: 09/16/2013 14:25    Scheduled Meds: . pantoprazole (PROTONIX) IV  40 mg Intravenous Q12H  . pneumococcal 23 valent vaccine  0.5 mL Intramuscular Tomorrow-1000  . potassium chloride  40 mEq Oral Q4H   Continuous Infusions: . sodium chloride 75 mL/hr at 09/17/13 0600    Principal Problem:   Abdominal distension Active Problems:   Essential hypertension, benign   High cholesterol   Ascites   Elevated transaminase level   Leukocytosis   Hypokalemia   Thrombocytopenia    Time spent: 35 minutes    [facility] Pager [phone]. If 7PM-7AM, please contact night-coverage at [email or web address], password [facility] 09/17/2013, 9:47 AM  LOS: 1 day

## 2013-09-17 NOTE — H&P (Signed)
[name] is an 53 y.o. female.   Chief Complaint: Pt with symptoms of abd pain; bloating and distension for days. +N/V; + jaundice CT/MRI reveal hepatomegaly with heterogeneous liver suspicious for underlying malignancy. Of note, Lymphadenopathy - hilar; mediastinal and porta hepatis Minimal ascites was seen on imaging--not enough to tap per notes Wbc 13.8; T Bili 5.9; Alk Phos 190; LFTs elevated Denies drug use or ETOH Request made for consult for liver biopsy per Dr Laural Golden Dr Anselm Pancoast has reviewed imaging and chart and pt now scheduled for same.  HPI: HTN; HLD; OSA  Past Medical History  Diagnosis Date  . Hypertension   . Hyperlipemia   . Depression   . Sleep apnea   . GERD (gastroesophageal reflux disease)   . [id number](644.0)     Past Surgical History  Procedure Laterality Date  . Wisdom tooth extraction    . Dilation and curettage of uterus      1986  . Colonoscopy  10/12/2011    Procedure: COLONOSCOPY;  Surgeon: Rogene Houston, MD;  Location: [facility];  Service: Endoscopy;  Laterality: N/A;  1030    Family History  Problem Relation Age of Onset  . COPD Mother   . COPD Father    Social History:  reports that she has been smoking.  She does not have any smokeless tobacco history on file. She reports that she does not drink alcohol or use illicit drugs.  Allergies: No Known Allergies  Medications Prior to Admission  Medication Sig Dispense Refill  . alum & mag hydroxide-simeth (MAALOX/MYLANTA) 200-200-20 MG/5ML suspension Take 15 mLs by mouth every 6 (six) hours as needed for indigestion or heartburn.      . citalopram (CELEXA) 10 MG tablet Take 10 mg by mouth daily.      Marland Kitchen esomeprazole (NEXIUM) 40 MG capsule Take 40 mg by mouth daily.      Marland Kitchen ibuprofen (ADVIL,MOTRIN) 200 MG tablet Take 400 mg by mouth daily as needed for headache.      . lisinopril (PRINIVIL,ZESTRIL) 10 MG tablet Take 5 mg by mouth every evening.       . promethazine (PHENERGAN) 12.5 MG  tablet Take 12.5 mg by mouth every 6 (six) hours as needed. nausea      . simvastatin (ZOCOR) 20 MG tablet Take 20 mg by mouth every evening.      Marland Kitchen spironolactone (ALDACTONE) 25 MG tablet Take 1 tablet (25 mg total) by mouth daily.  60 tablet  0  . traZODone (DESYREL) 100 MG tablet Take 50-100 mg by mouth at bedtime.      . Vitamin D, Ergocalciferol, (DRISDOL) 50000 UNITS CAPS Take 50,000 Units by mouth every 7 (seven) days. On wednesday        Results for orders placed during the hospital encounter of 09/16/13 (from the past 48 hour(s))  COMPREHENSIVE METABOLIC PANEL     Status: Abnormal   Collection Time    09/16/13 10:29 AM      Result Value Ref Range   Sodium 140  137 - 147 mEq/L   Potassium 3.2 (*) 3.7 - 5.3 mEq/L   Chloride 91 (*) 96 - 112 mEq/L   CO2 33 (*) 19 - 32 mEq/L   Glucose, Bld 100 (*) 70 - 99 mg/dL   BUN 27 (*) 6 - 23 mg/dL   Creatinine, Ser 0.96  0.50 - 1.10 mg/dL   Calcium 8.9  8.4 - 10.5 mg/dL   Total Protein 6.2  6.0 -  8.3 g/dL   Albumin 3.2 (*) 3.5 - 5.2 g/dL   AST 120 (*) 0 - 37 U/L   ALT 81 (*) 0 - 35 U/L   Alkaline Phosphatase 227 (*) 39 - 117 U/L   Total Bilirubin 6.6 (*) 0.3 - 1.2 mg/dL   GFR calc non Af Amer 66 (*) >90 mL/min   GFR calc Af Amer 77 (*) >90 mL/min   Comment: (NOTE)     The eGFR has been calculated using the CKD EPI equation.     This calculation has not been validated in all clinical situations.     eGFR's persistently <90 mL/min signify possible Chronic Kidney     Disease.  CBC WITH DIFFERENTIAL     Status: Abnormal   Collection Time    09/16/13 10:29 AM      Result Value Ref Range   WBC 13.1 (*) 4.0 - 10.5 K/uL   RBC 4.32  3.87 - 5.11 MIL/uL   Hemoglobin 13.4  12.0 - 15.0 g/dL   HCT 40.4  36.0 - 46.0 %   MCV 93.5  78.0 - 100.0 fL   MCH 31.0  26.0 - 34.0 pg   MCHC 33.2  30.0 - 36.0 g/dL   RDW 14.6  11.5 - 15.5 %   Platelets 65 (*) 150 - 400 K/uL   Neutrophils Relative % 84 (*) 43 - 77 %   Neutro Abs 11.1 (*) 1.7 - 7.7 K/uL    Lymphocytes Relative 11 (*) 12 - 46 %   Lymphs Abs 1.5  0.7 - 4.0 K/uL   Monocytes Relative 4  3 - 12 %   Monocytes Absolute 0.5  0.1 - 1.0 K/uL   Eosinophils Relative 0  0 - 5 %   Eosinophils Absolute 0.0  0.0 - 0.7 K/uL   Basophils Relative 0  0 - 1 %   Basophils Absolute 0.0  0.0 - 0.1 K/uL   WBC Morphology ATYPICAL LYMPHOCYTES    MAGNESIUM     Status: Abnormal   Collection Time    09/16/13 10:35 AM      Result Value Ref Range   Magnesium 2.7 (*) 1.5 - 2.5 mg/dL  PROTIME-INR     Status: None   Collection Time    09/16/13 10:35 AM      Result Value Ref Range   Prothrombin Time 15.1  11.6 - 15.2 seconds   INR 1.22  0.00 - 1.49  AFP TUMOR MARKER     Status: None   Collection Time    09/16/13 10:35 AM      Result Value Ref Range   AFP-Tumor Marker 6.9  0.0 - 8.0 ng/mL   Comment: (NOTE)     The [employer] Centaur AFP immunoassay method is used.  Results obtained     with different assay methods or kits cannot be used interchangeably.     AFP is a valuable aid in the management of nonseminomatous testicular     cancer patients when used in conjunction with information available     from the clinical evaluation and other diagnostic procedures.     Increased serum AFP concentrations have also been observed in ataxia     telangiectasia, hereditary tyrosinemia, primary hepatocellular     carcinoma, teratocarcinoma, gastrointestinal tract cancers with and     without liver metastases, and in benign hepatic conditions such as     acute viral hepatitis, chronic active hepatitis, and cirrhosis.  This     result cannot  be interpreted as absolute evidence of the presence or     absence of malignant disease.  This result is not interpretable in     pregnant females.     Performed at [facility] DEHYDROGENASE     Status: Abnormal   Collection Time    09/16/13 10:35 AM      Result Value Ref Range   LDH 1271 (*) 94 - 250 U/L  MRSA PCR SCREENING     Status: None   Collection  Time    09/16/13  1:41 PM      Result Value Ref Range   MRSA by PCR NEGATIVE  NEGATIVE   Comment:            The GeneXpert MRSA Assay (FDA     approved for NASAL specimens     only), is one component of a     comprehensive MRSA colonization     surveillance program. It is not     intended to diagnose MRSA     infection nor to guide or     monitor treatment for     MRSA infections.  CULTURE, BLOOD (ROUTINE X 2)     Status: None   Collection Time    09/16/13  1:48 PM      Result Value Ref Range   Specimen Description BLOOD RIGHT HAND     Special Requests BOTTLES DRAWN AEROBIC AND ANAEROBIC 8CC     Culture NO GROWTH 1 DAY     Report Status PENDING    CULTURE, BLOOD (ROUTINE X 2)     Status: None   Collection Time    09/16/13  1:53 PM      Result Value Ref Range   Specimen Description BLOOD LEFT ANTECUBITAL     Special Requests BOTTLES DRAWN AEROBIC AND ANAEROBIC 10CC     Culture NO GROWTH 1 DAY     Report Status PENDING    URINALYSIS, ROUTINE W REFLEX MICROSCOPIC     Status: Abnormal   Collection Time    09/16/13  4:08 PM      Result Value Ref Range   Color, Urine AMBER (*) YELLOW   Comment: BIOCHEMICALS MAY BE AFFECTED BY COLOR   APPearance CLEAR  CLEAR   Specific Gravity, Urine 1.025  1.005 - 1.030   pH 5.5  5.0 - 8.0   Glucose, UA NEGATIVE  NEGATIVE mg/dL   Hgb urine dipstick NEGATIVE  NEGATIVE   Bilirubin Urine MODERATE (*) NEGATIVE   Ketones, ur TRACE (*) NEGATIVE mg/dL   Protein, ur NEGATIVE  NEGATIVE mg/dL   Urobilinogen, UA 0.2  0.0 - 1.0 mg/dL   Nitrite NEGATIVE  NEGATIVE   Leukocytes, UA NEGATIVE  NEGATIVE   Comment: MICROSCOPIC NOT DONE ON URINES WITH NEGATIVE PROTEIN, BLOOD, LEUKOCYTES, NITRITE, OR GLUCOSE <1000 mg/dL.  CBC     Status: Abnormal   Collection Time    09/17/13  4:46 AM      Result Value Ref Range   WBC 13.8 (*) 4.0 - 10.5 K/uL   RBC 3.95  3.87 - 5.11 MIL/uL   Hemoglobin 12.4  12.0 - 15.0 g/dL   HCT 37.3  36.0 - 46.0 %   MCV 94.4  78.0 -  100.0 fL   MCH 31.4  26.0 - 34.0 pg   MCHC 33.2  30.0 - 36.0 g/dL   RDW 15.0  11.5 - 15.5 %   Platelets 57 (*) 150 - 400 K/uL  Comment: SPECIMEN CHECKED FOR CLOTS     PLATELET COUNT CONFIRMED BY SMEAR  BASIC METABOLIC PANEL     Status: Abnormal   Collection Time    09/17/13  4:46 AM      Result Value Ref Range   Sodium 138  137 - 147 mEq/L   Potassium 3.1 (*) 3.7 - 5.3 mEq/L   Chloride 94 (*) 96 - 112 mEq/L   CO2 29  19 - 32 mEq/L   Glucose, Bld 114 (*) 70 - 99 mg/dL   BUN 28 (*) 6 - 23 mg/dL   Creatinine, Ser 0.89  0.50 - 1.10 mg/dL   Calcium 8.2 (*) 8.4 - 10.5 mg/dL   GFR calc non Af Amer 73 (*) >90 mL/min   GFR calc Af Amer 84 (*) >90 mL/min   Comment: (NOTE)     The eGFR has been calculated using the CKD EPI equation.     This calculation has not been validated in all clinical situations.     eGFR's persistently <90 mL/min signify possible Chronic Kidney     Disease.  HEPATIC FUNCTION PANEL     Status: Abnormal   Collection Time    09/17/13  4:46 AM      Result Value Ref Range   Total Protein 5.2 (*) 6.0 - 8.3 g/dL   Albumin 2.7 (*) 3.5 - 5.2 g/dL   AST 103 (*) 0 - 37 U/L   ALT 77 (*) 0 - 35 U/L   Alkaline Phosphatase 190 (*) 39 - 117 U/L   Total Bilirubin 5.9 (*) 0.3 - 1.2 mg/dL   Bilirubin, Direct 4.8 (*) 0.0 - 0.3 mg/dL   Indirect Bilirubin 1.1 (*) 0.3 - 0.9 mg/dL   Mr Abdomen W Wo Contrast  09/17/2013   CLINICAL DATA:  Abdominal pain with ascites and liver lesions on CT. No history of malignancy.  EXAM: MRI ABDOMEN WITHOUT AND WITH CONTRAST  TECHNIQUE: Multiplanar multisequence MR imaging of the abdomen was performed both before and after the administration of intravenous contrast.  CONTRAST:  87m MULTIHANCE GADOBENATE DIMEGLUMINE 529 MG/ML IV SOLN  COMPARISON:  Abdominal CT 09/11/2013. Limited ultrasound 09/16/2013.  FINDINGS: The liver is enlarged, measuring 15.9 x 25.3 x 24.8 cm. As demonstrated on CT, the liver is diffusely heterogeneous in signal. There are  multiple areas of heterogeneous T2 hyperintensity and T1 hypo intensity. Some of these are peripheral and wedge-shaped, although others are rounded. There is associated restricted diffusion. No typical loss of signal on the gradient echo opposed phase imaging is demonstrated to suggest focal fat. Post-contrast, there is some ring enhancement of many of the smaller rounded lesions.  The hepatic and portal veins are patent. Hepatic vessels course through some of the lesions. There is extrinsic mass effect on the hepatic veins and IVC. The left periumbilical vein appears recanalized. No varices are demonstrated. There is a moderate amount of ascites.  The spleen is normal in size without focal abnormality. Several prominent lymph nodes are present within the porta hepatis. The coronal images also demonstrate right hilar and subcarinal lymphadenopathy.  There is gallbladder wall edema without focal fluid collection or biliary dilatation. The pancreas, adrenal glands and kidneys appear unremarkable. There are probable scattered hemangiomas within the spine.  IMPRESSION: 1. Diffuse abnormality of the liver associated with hepatomegaly, heterogeneous signal and enhancement. Features are nonspecific and not typical for hepatic steatosis. I believe the findings are most consistent with an infiltrative/inflammatory process, potentially hepatic inflammatory pseudotumor or sarcoidosis given the  associated adenopathy in the right hilum, mediastinum and porta hepatis. However, infiltrative neoplasm cannot be completely excluded. Tissue sampling recommended. 2. Associated ascites and gallbladder wall edema, attributed to adjacent liver disease/hypoproteinemia. No typical findings of portal hypertension. 3. Findings have been discussed by telephone with Dr. Laural Golden.   Electronically Signed   By: Camie Patience M.D.   On: 09/17/2013 09:11   US Abdomen Limited  09/16/2013   CLINICAL DATA:  Ascites.  Possible paracentesis.  EXAM:  LIMITED ABDOMEN ULTRASOUND FOR ASCITES  TECHNIQUE: Limited ultrasound survey for ascites was performed in all four abdominal quadrants.  COMPARISON:  CT abdomen pelvis 09/11/2013.  FINDINGS: There is a small amount of fluid in the right upper quadrant. Minimal fluid in the lower quadrants bilaterally.  IMPRESSION: Small ascites.  Paracentesis not performed.   Electronically Signed   By: Lorin Picket M.D.   On: 09/16/2013 16:04   Dg Chest Port 1 View  09/16/2013   CLINICAL DATA:  53 year old female with leukocytosis. Initial encounter.  EXAM: PORTABLE CHEST - 1 VIEW  COMPARISON:  CT Abdomen and Pelvis 09/11/2013.  FINDINGS: Portable AP upright view at 1418 hrs. Mildly lordotic view. Normal cardiac size and mediastinal contours. Visualized tracheal air column is within normal limits. Allowing for portable technique, the lungs are clear. No pneumothorax or pleural effusion identified.  IMPRESSION: No acute cardiopulmonary abnormality.   Electronically Signed   By: Lars Pinks M.D.   On: 09/16/2013 14:25    Review of Systems  Constitutional: Positive for weight loss. Negative for fever.  Respiratory: Positive for shortness of breath.   Cardiovascular: Negative for chest pain.  Gastrointestinal: Positive for nausea, vomiting and abdominal pain.  Musculoskeletal: Positive for back pain.  Neurological: Positive for weakness and headaches.    There were no vitals taken for this visit. Physical Exam  Constitutional: She appears well-nourished.  Cardiovascular: Normal rate and regular rhythm.   No murmur heard. Respiratory: Effort normal and breath sounds normal. She has no wheezes.  GI: Soft. Bowel sounds are normal. She exhibits distension. There is tenderness.  Musculoskeletal: Normal range of motion.  Neurological: She is alert.  Skin: Skin is warm and dry.  Psychiatric: She has a normal mood and affect. Her behavior is normal. Judgment and thought content normal.     Assessment/Plan Abd pain;  distension; bloating CT/MRI reveal hepatomegaly with heterogeneous liver Hilar, mediastinal and porta hepatis LAN Now scheduled for liver core biopsy and possible paracentesis Pt aware of procedure benefits and risks and agreeable to proceed Consent signed and in chart   Lavonia Drafts 09/17/2013, 1:00 PM

## 2013-09-17 NOTE — Procedures (Signed)
US guided paracentesis, removed 1.2 liters of yellow fluid.  US guided FNA and core biopsies of left hepatic lobe.  No immediate complication.

## 2013-09-17 NOTE — Progress Notes (Signed)
Patient underwent abdominal paracentesis and liver biopsy without difficulty. According to Dr. Anselm Pancoast FNA suspicious for lymphoma and/or small cell tumor. Will cancel ACE level and other markers which were ordered this morning.

## 2013-09-17 NOTE — Progress Notes (Addendum)
Patient continues to complain of upper abdominal pain. He has poor appetite but denies nausea vomiting fever or chills. She has been on statin since 2009 and has not experienced any side effects. She does not take OTC herbals. Patient is alert and does not have asterixis. Sclerae is icteric. Abdomen is full and soft with tender hepatomegaly. Left lobe of the liver is firm and tender. No LE edema noted.  Lab data; WBC 13.8, H&H 12.4 and 37.3 and platelet count 57K. Bilirubin 5.9, direct 4.8, AP 119, AST 103, ALT 77 and albumin 2.7 Serum potassium 3.1  MRI liver reviewed with Dr. Thornton Papas. Official reading pending.  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 Patient will need tissue for diagnosis. Thrombocytopenia. She does not have enlarged spleen Hypokalemia. Patient is getting KCl supplement.  Recommendations; Ultrasound-guided liver biopsy by Dr. Markus Daft with whom I have conferred with. Patient and her family members agree to proceed with liver biopsy. Sedimentation rate, ANA, SMA protein electrophoresis and immunoglobulins ACE level.

## 2013-09-17 NOTE — Progress Notes (Signed)
Report called to nurse in [place] at [facility]. Patient transported by [employer]. Husband to arrive at facility.

## 2013-09-18 ENCOUNTER — Inpatient Hospital Stay (HOSPITAL_COMMUNITY): Payer: 59

## 2013-09-18 ENCOUNTER — Encounter (HOSPITAL_COMMUNITY): Payer: Self-pay | Admitting: Internal Medicine

## 2013-09-18 DIAGNOSIS — C7949 Secondary malignant neoplasm of other parts of nervous system: Secondary | ICD-10-CM

## 2013-09-18 DIAGNOSIS — C801 Malignant (primary) neoplasm, unspecified: Secondary | ICD-10-CM

## 2013-09-18 DIAGNOSIS — Z87891 Personal history of nicotine dependence: Secondary | ICD-10-CM

## 2013-09-18 DIAGNOSIS — G9340 Encephalopathy, unspecified: Secondary | ICD-10-CM | POA: Diagnosis present

## 2013-09-18 DIAGNOSIS — C7931 Secondary malignant neoplasm of brain: Secondary | ICD-10-CM

## 2013-09-18 DIAGNOSIS — D72819 Decreased white blood cell count, unspecified: Secondary | ICD-10-CM

## 2013-09-18 DIAGNOSIS — C787 Secondary malignant neoplasm of liver and intrahepatic bile duct: Principal | ICD-10-CM

## 2013-09-18 LAB — BASIC METABOLIC PANEL
BUN: 37 mg/dL — ABNORMAL HIGH (ref 6–23)
CALCIUM: 8.2 mg/dL — AB (ref 8.4–10.5)
CO2: 27 mEq/L (ref 19–32)
Chloride: 95 mEq/L — ABNORMAL LOW (ref 96–112)
Creatinine, Ser: 0.88 mg/dL (ref 0.50–1.10)
GFR calc Af Amer: 85 mL/min — ABNORMAL LOW (ref >90)
GFR, EST NON AFRICAN AMERICAN: 74 mL/min — AB (ref >90)
GLUCOSE: 123 mg/dL — AB (ref 70–99)
Potassium: 3.5 mEq/L — ABNORMAL LOW (ref 3.7–5.3)
SODIUM: 138 meq/L (ref 137–147)

## 2013-09-18 LAB — CBC WITH DIFFERENTIAL/PLATELET
BASOS ABS: 0 10*3/uL (ref 0.0–0.1)
Basophils Relative: 0 % (ref 0–1)
EOS ABS: 0 10*3/uL (ref 0.0–0.7)
EOS PCT: 0 % (ref 0–5)
HEMATOCRIT: 38 % (ref 36.0–46.0)
Hemoglobin: 12.6 g/dL (ref 12.0–15.0)
LYMPHS PCT: 8 % — AB (ref 12–46)
Lymphs Abs: 1.3 10*3/uL (ref 0.7–4.0)
MCH: 31.4 pg (ref 26.0–34.0)
MCHC: 33.2 g/dL (ref 30.0–36.0)
MCV: 94.8 fL (ref 78.0–100.0)
MONO ABS: 0.5 10*3/uL (ref 0.1–1.0)
Monocytes Relative: 3 % (ref 3–12)
Neutro Abs: 15.4 10*3/uL — ABNORMAL HIGH (ref 1.7–7.7)
Neutrophils Relative %: 89 % — ABNORMAL HIGH (ref 43–77)
PLATELETS: 53 10*3/uL — AB (ref 150–400)
RBC: 4.01 MIL/uL (ref 3.87–5.11)
RDW: 15.1 % (ref 11.5–15.5)
WBC: 17.3 10*3/uL — AB (ref 4.0–10.5)

## 2013-09-18 LAB — HEPATIC FUNCTION PANEL
ALK PHOS: 187 U/L — AB (ref 39–117)
ALT: 80 U/L — AB (ref 0–35)
AST: 105 U/L — AB (ref 0–37)
Albumin: 2.6 g/dL — ABNORMAL LOW (ref 3.5–5.2)
BILIRUBIN TOTAL: 6.4 mg/dL — AB (ref 0.3–1.2)
Bilirubin, Direct: 5.3 mg/dL — ABNORMAL HIGH (ref 0.0–0.3)
Indirect Bilirubin: 1.1 mg/dL — ABNORMAL HIGH (ref 0.3–0.9)
TOTAL PROTEIN: 5.3 g/dL — AB (ref 6.0–8.3)

## 2013-09-18 LAB — AMMONIA: Ammonia: 69 umol/L — ABNORMAL HIGH (ref 11–60)

## 2013-09-18 LAB — SEDIMENTATION RATE: SED RATE: 10 mm/h (ref 0–22)

## 2013-09-18 LAB — MAGNESIUM: MAGNESIUM: 2.5 mg/dL (ref 1.5–2.5)

## 2013-09-18 MED ORDER — OXYCODONE HCL 5 MG PO TABS
5.0000 mg | ORAL_TABLET | ORAL | Status: DC | PRN
  Administered 2013-09-18 – 2013-09-20 (×8): 5 mg via ORAL
  Filled 2013-09-18 (×8): qty 1

## 2013-09-18 MED ORDER — POTASSIUM CHLORIDE CRYS ER 20 MEQ PO TBCR
40.0000 meq | EXTENDED_RELEASE_TABLET | Freq: Once | ORAL | Status: AC
  Administered 2013-09-18: 40 meq via ORAL
  Filled 2013-09-18: qty 2

## 2013-09-18 MED ORDER — DEXAMETHASONE SODIUM PHOSPHATE 4 MG/ML IJ SOLN
8.0000 mg | Freq: Three times a day (TID) | INTRAMUSCULAR | Status: DC
  Administered 2013-09-18 – 2013-09-20 (×6): 8 mg via INTRAVENOUS
  Filled 2013-09-18 (×7): qty 2

## 2013-09-18 MED ORDER — LORAZEPAM 1 MG PO TABS
1.0000 mg | ORAL_TABLET | Freq: Two times a day (BID) | ORAL | Status: DC | PRN
Start: 2013-09-18 — End: 2013-09-20
  Administered 2013-09-18 – 2013-09-19 (×2): 1 mg via ORAL
  Filled 2013-09-18: qty 1
  Filled 2013-09-18: qty 2

## 2013-09-18 MED ORDER — IOHEXOL 300 MG/ML  SOLN
80.0000 mL | Freq: Once | INTRAMUSCULAR | Status: AC | PRN
  Administered 2013-09-18: 80 mL via INTRAVENOUS

## 2013-09-18 MED ORDER — GADOBENATE DIMEGLUMINE 529 MG/ML IV SOLN
13.0000 mL | Freq: Once | INTRAVENOUS | Status: AC | PRN
  Administered 2013-09-18: 13 mL via INTRAVENOUS

## 2013-09-18 NOTE — Progress Notes (Signed)
Liver biopsy results reviewed with Dr. Mali Rund Patient has small cell carcinoma; special stains requested. Ms. Laura Carrel, NP and I discussed diagnosis with the patient's husband their daughter and family friend Ms. Robin lovings. Oncology consultation requested. Will order chest CT and MRI of the brain.

## 2013-09-18 NOTE — Progress Notes (Signed)
[facility] PROGRESS NOTE  [name] SWH:[id number] DOB: 04-06-1961 DOA: 09/16/2013 PCP: Dear, Trude Mcburney, MD   53 y/o female admitted 09/16/13, know Htn, [place], [place],. ? 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  S /P paracentesis 6/9/ yielding 1.2L fluid with protien 1.3 and LD 223.   Dr Laural Golden assistance appreciated.  MRI brain shows.3 mets 5 x 65m, 6 x 780m And 50m46met  MRI abdomen pelvis shows diffuse hepatomegaly + steatosis Ct chest is pending Oncology consulted and appreciate input in advance  Active Problems:  Elevated transaminase level: See #1. Hep B and C Negative. Trending up slightly today.   No history of EtOH or IV drug use. Will check ammonia level.   Encephalopathy: likely related to worsening liver function secondary to #1.  This is cleared a little and she was sleepy earlie rin the day when examined by NP  Leukocytosis: trending up somewhat.  Was on IV levaquin d/c earlier this admission May be reactive or 2/2 to Cancer  She is afebrile and non-toxic appearing.  Await urinalysis as well.   Hypokalemia: mild. Will replete and recheck. Magnesium 2.7 on admission. Will recheck.    Code Status: full Family Communication: husband  at bedside Disposition Plan: transfer to tele today   Consultants:  Dr. RehLaural Goldenstroenterology  oncology  Procedures:  Paracentesis 09/17/13  Liver biopsy 09/17/13  Antibiotics:  none  HPI/Subjective: Awake but somewhat lethargic denies pain/discomfort  Objective: Filed Vitals:   09/18/13 0900  BP:   Pulse:   Temp:   Resp: 16    Intake/Output Summary (Last 24 hours) at 09/18/13 0914 Last data filed at 09/17/13 2313  Gross per 24 hour  Intake      0 ml  Output    200 ml  Net   -200 ml   Filed  Weights   09/16/13 1332 09/17/13 0500 09/18/13 0500  Weight: 67 kg (147 lb 11.3 oz) 67 kg (147 lb 11.3 oz) 68 kg (149 lb 14.6 oz)    Exam:   General:  Well nourished appears comfortable  Cardiovascular: RRR No MGR No LE edema PPP  Respiratory: normal effort somewhat shallow. BS clear bilaterally no wheeze no rhonchi  Abdomen: remains markedly distended and firm. Diffuse tenderness worse in upper quadrant  Musculoskeletal: joints without swelling/erythema   Data Reviewed: Basic Metabolic Panel:  Recent Labs Lab 09/11/13 1112 09/16/13 1029 09/16/13 1035 09/17/13 0446 09/18/13 0459  NA 144 140  --  138 138  K 3.3* 3.2*  --  3.1* 3.5*  CL 99 91*  --  94* 95*  CO2 33* 33*  --  29 27  GLUCOSE 85 100*  --  114* 123*  BUN 24* 27*  --  28* 37*  CREATININE 1.01 0.96  --  0.89 0.88  CALCIUM 8.7 8.9  --  8.2* 8.2*  MG  --   --  2.7*  --   --    Liver Function Tests:  Recent Labs Lab 09/11/13 1112 09/16/13 1029 09/17/13 0446 09/18/13 0459  AST 73* 120* 103* 105*  ALT 63* 81* 77* 80*  ALKPHOS 161* 227* 190* 187*  BILITOT 1.4* 6.6* 5.9* 6.4*  PROT 5.6* 6.2 5.2* 5.3*  ALBUMIN 3.4* 3.2* 2.7* 2.6*   No results found for this basename: LIPASE, AMYLASE,  in the last 168 hours No results found for this basename: AMMONIA,  in the last 168 hours CBC:  Recent Labs Lab 09/11/13 1112 09/16/13 1029 09/17/13 0446 09/18/13 0459  WBC 13.7* 13.1* 13.8* 17.3*  NEUTROABS 11.2* 11.1*  --  15.4*  HGB 14.2 13.4 12.4 12.6  HCT 41.6 40.4 37.3 38.0  MCV 90.0 93.5 94.4 94.8  PLT 109* 65* 57* 53*   Cardiac Enzymes: No results found for this basename: CKTOTAL, CKMB, CKMBINDEX, TROPONINI,  in the last 168 hours BNP (last 3 results) No results found for this basename: PROBNP,  in the last 8760 hours CBG: No results found for this basename: GLUCAP,  in the last 168 hours  Recent Results (from the past 240 hour(s))  URINE CULTURE     Status: None   Collection Time    09/14/13  6:17  PM      Result Value Ref Range Status   Specimen Description URINE, CLEAN CATCH   Final   Special Requests NONE   Final   Culture  Setup Time     Final   Value: 09/15/2013 21:03     Performed at [facility]     Final   Value: 6,000 COLONIES/ML     Performed at [facility]   Culture     Final   Value: INSIGNIFICANT GROWTH     Performed at [facility]   Report Status 09/16/2013 FINAL   Final  MRSA PCR SCREENING     Status: None   Collection Time    09/16/13  1:41 PM      Result Value Ref Range Status   MRSA by PCR NEGATIVE  NEGATIVE Final   Comment:            The GeneXpert MRSA Assay (FDA     approved for NASAL specimens     only), is one component of a     comprehensive MRSA colonization     surveillance program. It is not     intended to diagnose MRSA     infection nor to guide or     monitor treatment for     MRSA infections.  CULTURE, BLOOD (ROUTINE X 2)     Status: None   Collection Time    09/16/13  1:48 PM      Result Value Ref Range Status   Specimen Description BLOOD RIGHT HAND   Final   Special Requests BOTTLES DRAWN AEROBIC AND ANAEROBIC 8CC   Final   Culture NO GROWTH 1 DAY   Final   Report Status PENDING   Incomplete  CULTURE, BLOOD (ROUTINE X 2)     Status: None   Collection Time    09/16/13  1:53 PM      Result Value Ref Range Status   Specimen Description BLOOD LEFT ANTECUBITAL   Final   Special Requests BOTTLES DRAWN AEROBIC AND ANAEROBIC 10CC   Final   Culture NO GROWTH 1 DAY   Final   Report Status PENDING   Incomplete  URINE CULTURE     Status: None   Collection Time    09/16/13  4:08 PM      Result Value Ref Range Status   Specimen Description URINE, CLEAN CATCH   Final   Special Requests NONE   Final   Culture  Setup Time     Final   Value: 09/16/2013 23:43     Performed at [facility]  Colony Count     Final   Value: NO GROWTH     Performed at [facility]   Culture     Final    Value: NO GROWTH     Performed at [facility]   Report Status 09/17/2013 FINAL   Final  BODY FLUID CULTURE     Status: None   Collection Time    09/17/13  3:17 PM      Result Value Ref Range Status   Specimen Description ASCITIC ABDOMEN FLUID   Final   Special Requests 60ML FLUID   Final   Gram Stain     Final   Value: RARE WBC PRESENT, PREDOMINANTLY MONONUCLEAR     NO ORGANISMS SEEN     Performed at [facility]   Culture     Final   Value: NO GROWTH 1 DAY     Performed at [facility]   Report Status PENDING   Incomplete  ANAEROBIC CULTURE     Status: None   Collection Time    09/17/13  3:17 PM      Result Value Ref Range Status   Specimen Description ASCITIC ABDOMEN   Final   Special Requests 60 ML   Final   Gram Stain     Final   Value: RARE WBC PRESENT, PREDOMINANTLY MONONUCLEAR     NO ORGANISMS SEEN     Performed at [facility]   Culture PENDING   Incomplete   Report Status PENDING   Incomplete     Studies: Mr Abdomen W Wo Contrast  09/17/2013   CLINICAL DATA:  Abdominal pain with ascites and liver lesions on CT. No history of malignancy.  EXAM: MRI ABDOMEN WITHOUT AND WITH CONTRAST  TECHNIQUE: Multiplanar multisequence MR imaging of the abdomen was performed both before and after the administration of intravenous contrast.  CONTRAST:  61m MULTIHANCE GADOBENATE DIMEGLUMINE 529 MG/ML IV SOLN  COMPARISON:  Abdominal CT 09/11/2013. Limited ultrasound 09/16/2013.  FINDINGS: The liver is enlarged, measuring 15.9 x 25.3 x 24.8 cm. As demonstrated on CT, the liver is diffusely heterogeneous in signal. There are multiple areas of heterogeneous T2 hyperintensity and T1 hypo intensity. Some of these are peripheral and wedge-shaped, although others are rounded. There is associated restricted diffusion. No typical loss of signal on the gradient echo opposed phase imaging is demonstrated to suggest focal fat. Post-contrast, there is some ring enhancement of  many of the smaller rounded lesions.  The hepatic and portal veins are patent. Hepatic vessels course through some of the lesions. There is extrinsic mass effect on the hepatic veins and IVC. The left periumbilical vein appears recanalized. No varices are demonstrated. There is a moderate amount of ascites.  The spleen is normal in size without focal abnormality. Several prominent lymph nodes are present within the porta hepatis. The coronal images also demonstrate right hilar and subcarinal lymphadenopathy.  There is gallbladder wall edema without focal fluid collection or biliary dilatation. The pancreas, adrenal glands and kidneys appear unremarkable. There are probable scattered hemangiomas within the spine.  IMPRESSION: 1. Diffuse abnormality of the liver associated with hepatomegaly, heterogeneous signal and enhancement. Features are nonspecific and not typical for hepatic steatosis. I believe the findings are most consistent with an infiltrative/inflammatory process, potentially hepatic inflammatory pseudotumor or sarcoidosis given the associated adenopathy in the right hilum, mediastinum and porta hepatis. However, infiltrative neoplasm cannot be completely excluded. Tissue sampling recommended. 2. Associated ascites and gallbladder wall edema, attributed  to adjacent liver disease/hypoproteinemia. No typical findings of portal hypertension. 3. Findings have been discussed by telephone with Dr. Laural Golden.   Electronically Signed   By: Camie Patience M.D.   On: 09/17/2013 09:11   US Abdomen Limited  09/16/2013   CLINICAL DATA:  Ascites.  Possible paracentesis.  EXAM: LIMITED ABDOMEN ULTRASOUND FOR ASCITES  TECHNIQUE: Limited ultrasound survey for ascites was performed in all four abdominal quadrants.  COMPARISON:  CT abdomen pelvis 09/11/2013.  FINDINGS: There is a small amount of fluid in the right upper quadrant. Minimal fluid in the lower quadrants bilaterally.  IMPRESSION: Small ascites.  Paracentesis not  performed.   Electronically Signed   By: Lorin Picket M.D.   On: 09/16/2013 16:04   US Biopsy  09/17/2013   CLINICAL DATA:  53 year old with abnormal liver enzymes, jaundice and concern for multiple liver lesions. Tissue diagnosis is needed.  EXAM: ULTRASOUND-GUIDED LIVER BIOPSY ; PARACENTESIS WITH ULTRASOUND GUIDANCE  Physician: Stephan Minister. Anselm Pancoast, MD  FLUOROSCOPY TIME:  None  MEDICATIONS: 150 mcg fentanyl, 3 mg versed. A radiology nurse monitored the patient for moderate sedation.  ANESTHESIA/SEDATION: Moderate sedation time: 35 min  PROCEDURE: The procedure was explained to the patient. The risks and benefits of the procedure were discussed and the patient's questions were addressed. Informed consent was obtained from the patient. The abdomen was evaluated with ultrasound. Ascites identified in the right perihepatic space. The left hepatic lobe was selected for biopsy. The right anterior abdomen was prepped with Betadine and a sterile field was created. Skin was anesthetized with 1% lidocaine. A Yueh catheter was directed into perihepatic ascites with ultrasound guidance and a paracentesis was performed. 1.2 L of yellow fluid was removed. The ascites resolved by ultrasound and the catheter was removed.  Attention was directed to the liver biopsy. The anterior abdomen was prepped with chlorhexidine and a sterile field was created. The liver is diffusely heterogeneous. The left inferior lobe is prominent and has areas of hypoechogenicity and hyperechogenicity. The skin and soft tissues were anesthetized with 1% lidocaine. A 17 gauge needle was directed into the inferior left hepatic lobe with ultrasound guidance. Two fine needle aspirations were obtained with 22 gauge needles. Three core biopsies were obtained with an 18 gauge core device. Two cores were placed in formalin and one core was placed in saline. A 17 gauge needle was removed without complication. Bandage placed over the puncture site.  FINDINGS: The  liver is diffusely heterogeneous. The inferior left hepatic lobe was biopsied.  Perihepatic ascites.  1.2 L of ascites was removed.  COMPLICATIONS: None  IMPRESSION: Ultrasound-guided fine needle aspirations and core biopsies of the left hepatic lobe.  Ultrasound-guided paracentesis.  1.2 L of fluid was removed.   Electronically Signed   By: Markus Daft M.D.   On: 09/17/2013 17:57   US Paracentesis  09/17/2013   CLINICAL DATA:  53 year old with abnormal liver enzymes, jaundice and concern for multiple liver lesions. Tissue diagnosis is needed.  EXAM: ULTRASOUND-GUIDED LIVER BIOPSY ; PARACENTESIS WITH ULTRASOUND GUIDANCE  Physician: Stephan Minister. Anselm Pancoast, MD  FLUOROSCOPY TIME:  None  MEDICATIONS: 150 mcg fentanyl, 3 mg versed. A radiology nurse monitored the patient for moderate sedation.  ANESTHESIA/SEDATION: Moderate sedation time: 35 min  PROCEDURE: The procedure was explained to the patient. The risks and benefits of the procedure were discussed and the patient's questions were addressed. Informed consent was obtained from the patient. The abdomen was evaluated with ultrasound. Ascites identified in the right perihepatic space. The  left hepatic lobe was selected for biopsy. The right anterior abdomen was prepped with Betadine and a sterile field was created. Skin was anesthetized with 1% lidocaine. A Yueh catheter was directed into perihepatic ascites with ultrasound guidance and a paracentesis was performed. 1.2 L of yellow fluid was removed. The ascites resolved by ultrasound and the catheter was removed.  Attention was directed to the liver biopsy. The anterior abdomen was prepped with chlorhexidine and a sterile field was created. The liver is diffusely heterogeneous. The left inferior lobe is prominent and has areas of hypoechogenicity and hyperechogenicity. The skin and soft tissues were anesthetized with 1% lidocaine. A 17 gauge needle was directed into the inferior left hepatic lobe with ultrasound guidance. Two  fine needle aspirations were obtained with 22 gauge needles. Three core biopsies were obtained with an 18 gauge core device. Two cores were placed in formalin and one core was placed in saline. A 17 gauge needle was removed without complication. Bandage placed over the puncture site.  FINDINGS: The liver is diffusely heterogeneous. The inferior left hepatic lobe was biopsied.  Perihepatic ascites.  1.2 L of ascites was removed.  COMPLICATIONS: None  IMPRESSION: Ultrasound-guided fine needle aspirations and core biopsies of the left hepatic lobe.  Ultrasound-guided paracentesis.  1.2 L of fluid was removed.   Electronically Signed   By: Markus Daft M.D.   On: 09/17/2013 17:57   Dg Chest Port 1 View  09/16/2013   CLINICAL DATA:  53 year old female with leukocytosis. Initial encounter.  EXAM: PORTABLE CHEST - 1 VIEW  COMPARISON:  CT Abdomen and Pelvis 09/11/2013.  FINDINGS: Portable AP upright view at 1418 hrs. Mildly lordotic view. Normal cardiac size and mediastinal contours. Visualized tracheal air column is within normal limits. Allowing for portable technique, the lungs are clear. No pneumothorax or pleural effusion identified.  IMPRESSION: No acute cardiopulmonary abnormality.   Electronically Signed   By: Lars Pinks M.D.   On: 09/16/2013 14:25    Scheduled Meds: . pantoprazole (PROTONIX) IV  40 mg Intravenous Q12H   Continuous Infusions:   Principal Problem:   Abdominal distension Active Problems:   Essential hypertension, benign   High cholesterol   Ascites   Elevated transaminase level   Leukocytosis   Hypokalemia   Thrombocytopenia    Time spent: 35 minutes    [facility] Pager [phone]. If 7PM-7AM, please contact night-coverage at [email or web address], password [facility] 09/18/2013, 9:14 AM  LOS: 2 days    seen and agree and ammended as per above  Verneita Griffes, MD [facility] [phone]

## 2013-09-18 NOTE — Consult Note (Signed)
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 Solt is a nice 53 year old Caucasian female with a past medical history significant for HTN and hypercholesterolemia who initially presented to her GI specialist with the complaint of dysphagia in May 2015.  On exam by Deberah Castle, she was suspicious for hepatomegaly which led to a number of imaging studies.  These will be dictated below.  She presented to the [facility] on 09/16/2013 with abdominal pain and distension.  On admission, she is noted to have a leukocytosis with thrombocytopenia, transaminitis, and hyperbilirubinemia.  Imaging studies were performed and she was subsequently admitted.   I personally reviewed and went over laboratory results with the patient.  The results are noted within this dictation.  I personally reviewed and went over radiographic studies with the patient.  The results are noted within this dictation.    I personally reviewed and went over pathology results with the patient. Pathology confirmation is pending.  The patient reports that she noted abdominal distension that was worsening.  She denies any hemoptysis.  She notes that she sudden lost her appetite for cigarettes quitting less than one week ago from 1 ppd to nothing.  She denies a history of EtOH.  Her appetite is fair.  She denies nausea and vomiting.  She admits to mild abdominal discomfort and her medication list lack any pain medication.  I have went over the data with the patient that is available to me.  She will be undergoing a CT of chest in the near future.  Her liver  imaging demonstrates significant tumor burden and her labs illustrate a sensitive situation with regards to her liver.  Her MRI of brain is positive for 3 foci of metastatic disease, all are subcentimeter.  Her pathology is not yet confirmed and a pathologist was not available to speak to me on the phone when I called prior to seeing the patient this afternoon.  I was educated that the pathology should be out tomorrow.  With a lack of confirmed histology, I did not go into details on the patient's diagnosis.  That piece of information is important to make future recommendations.  In the interim, we will focus on symptom management and code status will need to be addressed in the near future.    PAST MEDICAL HISTORY:   Past Medical History  Diagnosis Date  . Hypertension   . Hyperlipemia   . Depression   . Sleep apnea   . GERD (gastroesophageal reflux disease)   . Headache(784.0)   . Small cell carcinoma     liver    ALLERGIES: No Known Allergies    MEDICATIONS: I have reviewed the patient's current medications.     PAST SURGICAL HISTORY Past Surgical History  Procedure Laterality Date  . Wisdom tooth extraction    . Dilation and curettage of uterus      1986  . Colonoscopy  10/12/2011    Procedure: COLONOSCOPY;  Surgeon: Rogene Houston, MD;  Location: [facility];  Service: Endoscopy;  Laterality: N/A;  1030    FAMILY HISTORY: Family  History  Problem Relation Age of Onset  . COPD Mother   . COPD Father     SOCIAL HISTORY:  reports that she has been smoking.  She does not have any smokeless tobacco history on file. She reports that she does not drink alcohol or use illicit drugs.  PERFORMANCE STATUS: The patient's performance status is 3 - Symptomatic, >50% confined to bed  PHYSICAL EXAM: Most Recent Vital Signs: Blood pressure 108/54, pulse 84, temperature 98.2 F (36.8 C), temperature source Oral, resp. rate 19, height 5' 6"  (1.676 m), weight 149 lb 14.6 oz (68 kg),  last menstrual period 08/14/2011, SpO2 96.00%. General appearance: alert, cooperative, mild distress and laying in right lateral decubitus position which is most comfortable for her Head: Normocephalic, without obvious abnormality, atraumatic Eyes: positive findings: sclera icterus noted Neck: supple, symmetrical, trachea midline Skin: thickened from smoking with jaundice Neurologic: Grossly normal  LABORATORY DATA:  Results for orders placed during the hospital encounter of 09/16/13 (from the past 48 hour(s))  URINE CULTURE     Status: None   Collection Time    09/16/13  4:08 PM      Result Value Ref Range   Specimen Description URINE, CLEAN CATCH     Special Requests NONE     Culture  Setup Time       Value: 09/16/2013 23:43     Performed at [facility]       Value: NO GROWTH     Performed at [facility]   Culture       Value: NO GROWTH     Performed at [facility]   Report Status 09/17/2013 FINAL    URINALYSIS, ROUTINE W REFLEX MICROSCOPIC     Status: Abnormal   Collection Time    09/16/13  4:08 PM      Result Value Ref Range   Color, Urine AMBER (*) YELLOW   Comment: BIOCHEMICALS MAY BE AFFECTED BY COLOR   APPearance CLEAR  CLEAR   Specific Gravity, Urine 1.025  1.005 - 1.030   pH 5.5  5.0 - 8.0   Glucose, UA NEGATIVE  NEGATIVE mg/dL   Hgb urine dipstick NEGATIVE  NEGATIVE   Bilirubin Urine MODERATE (*) NEGATIVE   Ketones, ur TRACE (*) NEGATIVE mg/dL   Protein, ur NEGATIVE  NEGATIVE mg/dL   Urobilinogen, UA 0.2  0.0 - 1.0 mg/dL   Nitrite NEGATIVE  NEGATIVE   Leukocytes, UA NEGATIVE  NEGATIVE   Comment: MICROSCOPIC NOT DONE ON URINES WITH NEGATIVE PROTEIN, BLOOD, LEUKOCYTES, NITRITE, OR GLUCOSE <1000 mg/dL.  CBC     Status: Abnormal   Collection Time    09/17/13  4:46 AM      Result Value Ref Range   WBC 13.8 (*) 4.0 - 10.5 K/uL   RBC 3.95  3.87 - 5.11 MIL/uL   Hemoglobin 12.4  12.0 - 15.0 g/dL   HCT 37.3  36.0 - 46.0 %    MCV 94.4  78.0 - 100.0 fL   MCH 31.4  26.0 - 34.0 pg   MCHC 33.2  30.0 - 36.0 g/dL   RDW 15.0  11.5 - 15.5 %   Platelets 57 (*) 150 - 400 K/uL   Comment: SPECIMEN CHECKED FOR CLOTS     PLATELET COUNT CONFIRMED BY SMEAR  BASIC METABOLIC PANEL     Status: Abnormal   Collection Time    09/17/13  4:46 AM      Result Value Ref  Range   Sodium 138  137 - 147 mEq/L   Potassium 3.1 (*) 3.7 - 5.3 mEq/L   Chloride 94 (*) 96 - 112 mEq/L   CO2 29  19 - 32 mEq/L   Glucose, Bld 114 (*) 70 - 99 mg/dL   BUN 28 (*) 6 - 23 mg/dL   Creatinine, Ser 0.89  0.50 - 1.10 mg/dL   Calcium 8.2 (*) 8.4 - 10.5 mg/dL   GFR calc non Af Amer 73 (*) >90 mL/min   GFR calc Af Amer 84 (*) >90 mL/min   Comment: (NOTE)     The eGFR has been calculated using the CKD EPI equation.     This calculation has not been validated in all clinical situations.     eGFR's persistently <90 mL/min signify possible Chronic Kidney     Disease.  HEPATIC FUNCTION PANEL     Status: Abnormal   Collection Time    09/17/13  4:46 AM      Result Value Ref Range   Total Protein 5.2 (*) 6.0 - 8.3 g/dL   Albumin 2.7 (*) 3.5 - 5.2 g/dL   AST 103 (*) 0 - 37 U/L   ALT 77 (*) 0 - 35 U/L   Alkaline Phosphatase 190 (*) 39 - 117 U/L   Total Bilirubin 5.9 (*) 0.3 - 1.2 mg/dL   Bilirubin, Direct 4.8 (*) 0.0 - 0.3 mg/dL   Indirect Bilirubin 1.1 (*) 0.3 - 0.9 mg/dL  LACTATE DEHYDROGENASE, BODY FLUID     Status: Abnormal   Collection Time    09/17/13  3:17 PM      Result Value Ref Range   LD, Fluid 223 (*) 3 - 23 U/L   Fluid Type-FLDH ASCITIC     Comment: Performed at [place] 06/09 AT 1911: PREVIOUSLY REPORTED AS Peritoneal  PROTEIN, BODY FLUID     Status: None   Collection Time    09/17/13  3:17 PM      Result Value Ref Range   Total protein, fluid 1.3     Comment: NO NORMAL RANGE ESTABLISHED FOR THIS TEST   Fluid Type-FTP ASCITIC     Comment: Performed at [place] 06/09 AT 1912:  PREVIOUSLY REPORTED AS Peritoneal  HEPATIC FUNCTION PANEL     Status: Abnormal   Collection Time    09/18/13  4:59 AM      Result Value Ref Range   Total Protein 5.3 (*) 6.0 - 8.3 g/dL   Albumin 2.6 (*) 3.5 - 5.2 g/dL   AST 105 (*) 0 - 37 U/L   ALT 80 (*) 0 - 35 U/L   Alkaline Phosphatase 187 (*) 39 - 117 U/L   Total Bilirubin 6.4 (*) 0.3 - 1.2 mg/dL   Bilirubin, Direct 5.3 (*) 0.0 - 0.3 mg/dL   Indirect Bilirubin 1.1 (*) 0.3 - 0.9 mg/dL  CBC WITH DIFFERENTIAL     Status: Abnormal   Collection Time    09/18/13  4:59 AM      Result Value Ref Range   WBC 17.3 (*) 4.0 - 10.5 K/uL   RBC 4.01  3.87 - 5.11 MIL/uL   Hemoglobin 12.6  12.0 - 15.0 g/dL   HCT 38.0  36.0 - 46.0 %   MCV 94.8  78.0 - 100.0 fL   MCH 31.4  26.0 - 34.0 pg   MCHC 33.2  30.0 - 36.0 g/dL  RDW 15.1  11.5 - 15.5 %   Platelets 53 (*) 150 - 400 K/uL   Comment: SPECIMEN CHECKED FOR CLOTS     CONSISTENT WITH PREVIOUS RESULT   Neutrophils Relative % 89 (*) 43 - 77 %   Neutro Abs 15.4 (*) 1.7 - 7.7 K/uL   Lymphocytes Relative 8 (*) 12 - 46 %   Lymphs Abs 1.3  0.7 - 4.0 K/uL   Monocytes Relative 3  3 - 12 %   Monocytes Absolute 0.5  0.1 - 1.0 K/uL   Eosinophils Relative 0  0 - 5 %   Eosinophils Absolute 0.0  0.0 - 0.7 K/uL   Basophils Relative 0  0 - 1 %   Basophils Absolute 0.0  0.0 - 0.1 K/uL  BASIC METABOLIC PANEL     Status: Abnormal   Collection Time    09/18/13  4:59 AM      Result Value Ref Range   Sodium 138  137 - 147 mEq/L   Potassium 3.5 (*) 3.7 - 5.3 mEq/L   Chloride 95 (*) 96 - 112 mEq/L   CO2 27  19 - 32 mEq/L   Glucose, Bld 123 (*) 70 - 99 mg/dL   BUN 37 (*) 6 - 23 mg/dL   Creatinine, Ser 0.88  0.50 - 1.10 mg/dL   Calcium 8.2 (*) 8.4 - 10.5 mg/dL   GFR calc non Af Amer 74 (*) >90 mL/min   GFR calc Af Amer 85 (*) >90 mL/min   Comment: (NOTE)     The eGFR has been calculated using the CKD EPI equation.     This calculation has not been validated in all clinical situations.     eGFR's  persistently <90 mL/min signify possible Chronic Kidney     Disease.  SEDIMENTATION RATE     Status: None   Collection Time    09/18/13  4:59 AM      Result Value Ref Range   Sed Rate 10  0 - 22 mm/hr  MAGNESIUM     Status: None   Collection Time    09/18/13  4:59 AM      Result Value Ref Range   Magnesium 2.5  1.5 - 2.5 mg/dL  AMMONIA     Status: Abnormal   Collection Time    09/18/13  9:33 AM      Result Value Ref Range   Ammonia 69 (*) 11 - 60 umol/L      RADIOGRAPHY: Mr Kizzie Fantasia Contrast  09/18/2013   CLINICAL DATA:  New diagnosis small cell carcinoma which is metastatic.  EXAM: MRI HEAD WITHOUT AND WITH CONTRAST  TECHNIQUE: Multiplanar, multiecho pulse sequences of the brain and surrounding structures were obtained without and with intravenous contrast.  CONTRAST:  81m MULTIHANCE GADOBENATE DIMEGLUMINE 529 MG/ML IV SOLN  COMPARISON:  MRI 12/24/2007  FINDINGS: The brain shows a background pattern of old small vessel infarctions within the hemispheric white matter. There chronic small vessel changes of the pons. No large vessel territory infarctions.  No metastasis is present in the posterior fossa. In the left occipital lobe axial image 47, there is a 5 x 7 mm metastasis with some central necrosis. No edema or mass effect. In the right frontoparietal junction region at the vertex, there is a 6 x 7 mm metastasis with central necrosis that does not show mass effect or edema. There is a 2 mm metastasis just posterior to the atrium of the right lateral ventricle.  No  hydrocephalus or extra-axial collection. No pituitary mass. No skull or skullbase lesion. No inflammatory sinus disease.  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 5 x 7 mm metastasis in the left occipital lobe. 6 x 7 mm metastasis in the right frontoparietal junction region at the vertex. 2 mm metastasis on the right just posterior to the atrium of right lateral ventricle. These are not associated with mass  effect or hemorrhage.  Chronic small vessel ischemic changes affecting the brain as outlined above.   Electronically Signed   By: Nelson Chimes M.D.   On: 09/18/2013 11:28   Mr Abdomen W Wo Contrast  09/17/2013   CLINICAL DATA:  Abdominal pain with ascites and liver lesions on CT. No history of malignancy.  EXAM: MRI ABDOMEN WITHOUT AND WITH CONTRAST  TECHNIQUE: Multiplanar multisequence MR imaging of the abdomen was performed both before and after the administration of intravenous contrast.  CONTRAST:  14m MULTIHANCE GADOBENATE DIMEGLUMINE 529 MG/ML IV SOLN  COMPARISON:  Abdominal CT 09/11/2013. Limited ultrasound 09/16/2013.  FINDINGS: The liver is enlarged, measuring 15.9 x 25.3 x 24.8 cm. As demonstrated on CT, the liver is diffusely heterogeneous in signal. There are multiple areas of heterogeneous T2 hyperintensity and T1 hypo intensity. Some of these are peripheral and wedge-shaped, although others are rounded. There is associated restricted diffusion. No typical loss of signal on the gradient echo opposed phase imaging is demonstrated to suggest focal fat. Post-contrast, there is some ring enhancement of many of the smaller rounded lesions.  The hepatic and portal veins are patent. Hepatic vessels course through some of the lesions. There is extrinsic mass effect on the hepatic veins and IVC. The left periumbilical vein appears recanalized. No varices are demonstrated. There is a moderate amount of ascites.  The spleen is normal in size without focal abnormality. Several prominent lymph nodes are present within the porta hepatis. The coronal images also demonstrate right hilar and subcarinal lymphadenopathy.  There is gallbladder wall edema without focal fluid collection or biliary dilatation. The pancreas, adrenal glands and kidneys appear unremarkable. There are probable scattered hemangiomas within the spine.  IMPRESSION: 1. Diffuse abnormality of the liver associated with hepatomegaly, heterogeneous  signal and enhancement. Features are nonspecific and not typical for hepatic steatosis. I believe the findings are most consistent with an infiltrative/inflammatory process, potentially hepatic inflammatory pseudotumor or sarcoidosis given the associated adenopathy in the right hilum, mediastinum and porta hepatis. However, infiltrative neoplasm cannot be completely excluded. Tissue sampling recommended. 2. Associated ascites and gallbladder wall edema, attributed to adjacent liver disease/hypoproteinemia. No typical findings of portal hypertension. 3. Findings have been discussed by telephone with Dr. RLaural Golden   Electronically Signed   By: BCamie PatienceM.D.   On: 09/17/2013 09:11   [place]  09/17/2013   CLINICAL DATA:  53year old with abnormal liver enzymes, jaundice and concern for multiple liver lesions. Tissue diagnosis is needed.  EXAM: ULTRASOUND-GUIDED LIVER BIOPSY ; PARACENTESIS WITH ULTRASOUND GUIDANCE  Physician: AStephan Minister HAnselm Pancoast MD  FLUOROSCOPY TIME:  None  MEDICATIONS: 150 mcg fentanyl, 3 mg versed. A radiology nurse monitored the patient for moderate sedation.  ANESTHESIA/SEDATION: Moderate sedation time: 35 min  PROCEDURE: The procedure was explained to the patient. The risks and benefits of the procedure were discussed and the patient's questions were addressed. Informed consent was obtained from the patient. The abdomen was evaluated with ultrasound. Ascites identified in the right perihepatic space. The left hepatic lobe was selected for biopsy. The right anterior abdomen was  prepped with Betadine and a sterile field was created. Skin was anesthetized with 1% lidocaine. A Yueh catheter was directed into perihepatic ascites with ultrasound guidance and a paracentesis was performed. 1.2 L of yellow fluid was removed. The ascites resolved by ultrasound and the catheter was removed.  Attention was directed to the liver biopsy. The anterior abdomen was prepped with chlorhexidine and a sterile field was  created. The liver is diffusely heterogeneous. The left inferior lobe is prominent and has areas of hypoechogenicity and hyperechogenicity. The skin and soft tissues were anesthetized with 1% lidocaine. A 17 gauge needle was directed into the inferior left hepatic lobe with ultrasound guidance. Two fine needle aspirations were obtained with 22 gauge needles. Three core biopsies were obtained with an 18 gauge core device. Two cores were placed in formalin and one core was placed in saline. A 17 gauge needle was removed without complication. Bandage placed over the puncture site.  FINDINGS: The liver is diffusely heterogeneous. The inferior left hepatic lobe was biopsied.  Perihepatic ascites.  1.2 L of ascites was removed.  COMPLICATIONS: None  IMPRESSION: Ultrasound-guided fine needle aspirations and core biopsies of the left hepatic lobe.  Ultrasound-guided paracentesis.  1.2 L of fluid was removed.   Electronically Signed   By: Markus Daft M.D.   On: 09/17/2013 17:57   US Paracentesis  09/17/2013   CLINICAL DATA:  53 year old with abnormal liver enzymes, jaundice and concern for multiple liver lesions. Tissue diagnosis is needed.  EXAM: ULTRASOUND-GUIDED LIVER BIOPSY ; PARACENTESIS WITH ULTRASOUND GUIDANCE  Physician: Stephan Minister. Anselm Pancoast, MD  FLUOROSCOPY TIME:  None  MEDICATIONS: 150 mcg fentanyl, 3 mg versed. A radiology nurse monitored the patient for moderate sedation.  ANESTHESIA/SEDATION: Moderate sedation time: 35 min  PROCEDURE: The procedure was explained to the patient. The risks and benefits of the procedure were discussed and the patient's questions were addressed. Informed consent was obtained from the patient. The abdomen was evaluated with ultrasound. Ascites identified in the right perihepatic space. The left hepatic lobe was selected for biopsy. The right anterior abdomen was prepped with Betadine and a sterile field was created. Skin was anesthetized with 1% lidocaine. A Yueh catheter was directed into  perihepatic ascites with ultrasound guidance and a paracentesis was performed. 1.2 L of yellow fluid was removed. The ascites resolved by ultrasound and the catheter was removed.  Attention was directed to the liver biopsy. The anterior abdomen was prepped with chlorhexidine and a sterile field was created. The liver is diffusely heterogeneous. The left inferior lobe is prominent and has areas of hypoechogenicity and hyperechogenicity. The skin and soft tissues were anesthetized with 1% lidocaine. A 17 gauge needle was directed into the inferior left hepatic lobe with ultrasound guidance. Two fine needle aspirations were obtained with 22 gauge needles. Three core biopsies were obtained with an 18 gauge core device. Two cores were placed in formalin and one core was placed in saline. A 17 gauge needle was removed without complication. Bandage placed over the puncture site.  FINDINGS: The liver is diffusely heterogeneous. The inferior left hepatic lobe was biopsied.  Perihepatic ascites.  1.2 L of ascites was removed.  COMPLICATIONS: None  IMPRESSION: Ultrasound-guided fine needle aspirations and core biopsies of the left hepatic lobe.  Ultrasound-guided paracentesis.  1.2 L of fluid was removed.   Electronically Signed   By: Markus Daft M.D.   On: 09/17/2013 17:57       PATHOLOGY:  PENDING   ASSESSMENT:  1. 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  CT of chest is pending.  Liver biopsy results are pending. High probability of small cell lung cancer vs lymphoma versus carcinoid (least likely). 2. Abdominal discomfort 3. H/O tobacco abuse 4. Leukocytosis, reactive to #1 5. Thrombocytopenia, reactive to #1 6. Transaminitis secondary to liver metastases   PLAN:  1. I personally reviewed and went over laboratory results with the patient.  The results are noted within this dictation. 2. I personally reviewed and went over radiographic studies with the patient.  The results are noted within this dictation.    3. CT of chest ordered and exam is pending 4. Pathology is pending 5. Dexamethasone 8 mg IV every 8 hours 6. Oxy IR 5 mg every 3 hours PRN. 7. Depending on response to pain medication and pain medication requirements, a long acting pain medication would be recommended.  I would recommend Fentanyl given the patient's liver status 8. I owe it to the patient to make future plans following confirmation of cytology as this could be small cell lung cancer versus carcinoid versus less likely lymphoma.  Once I have that information, further recommendations will follow. 9.  Code status needs addressing.  Diagnosis will help with this conversation. 10. We will follow closely with you.  All questions were answered. The patient knows to call the clinic with any problems, questions or concerns. We can certainly see the patient much sooner if necessary.  Patient and plan discussed with Dr. Farrel Gobble and he is in agreement with the aforementioned.    KEFALAS,THOMAS 09/18/2013 Pager: [phone]

## 2013-09-19 DIAGNOSIS — C3490 Malignant neoplasm of unspecified part of unspecified bronchus or lung: Secondary | ICD-10-CM

## 2013-09-19 LAB — COMPREHENSIVE METABOLIC PANEL
ALT: 99 U/L — ABNORMAL HIGH (ref 0–35)
AST: 125 U/L — AB (ref 0–37)
Albumin: 2.7 g/dL — ABNORMAL LOW (ref 3.5–5.2)
Alkaline Phosphatase: 229 U/L — ABNORMAL HIGH (ref 39–117)
BILIRUBIN TOTAL: 8.1 mg/dL — AB (ref 0.3–1.2)
BUN: 42 mg/dL — ABNORMAL HIGH (ref 6–23)
CHLORIDE: 96 meq/L (ref 96–112)
CO2: 25 mEq/L (ref 19–32)
Calcium: 8.6 mg/dL (ref 8.4–10.5)
Creatinine, Ser: 0.92 mg/dL (ref 0.50–1.10)
GFR calc Af Amer: 81 mL/min — ABNORMAL LOW (ref >90)
GFR calc non Af Amer: 70 mL/min — ABNORMAL LOW (ref >90)
Glucose, Bld: 132 mg/dL — ABNORMAL HIGH (ref 70–99)
POTASSIUM: 4.3 meq/L (ref 3.7–5.3)
Sodium: 137 mEq/L (ref 137–147)
TOTAL PROTEIN: 5.4 g/dL — AB (ref 6.0–8.3)

## 2013-09-19 LAB — CBC
HCT: 39.5 % (ref 36.0–46.0)
Hemoglobin: 13 g/dL (ref 12.0–15.0)
MCH: 31.3 pg (ref 26.0–34.0)
MCHC: 32.9 g/dL (ref 30.0–36.0)
MCV: 95 fL (ref 78.0–100.0)
Platelets: 47 10*3/uL — ABNORMAL LOW (ref 150–400)
RBC: 4.16 MIL/uL (ref 3.87–5.11)
RDW: 15.4 % (ref 11.5–15.5)
WBC: 21.2 10*3/uL — AB (ref 4.0–10.5)

## 2013-09-19 NOTE — Progress Notes (Signed)
Subjective: Patient seen in bed awake.  She is comfortable.    Patient's husband and daughter are at the bedside.  I have discussed the case with pathology, Dr. Donato Heinz.  He has updated the pathology report to reflect recent staining results.  The diagnosis is poorly differentiated small cell lung cancer.  I personally reviewed and went over pathology results with the patient.  I personally reviewed and went over radiographic studies with the patient.  The results are noted within this dictation.  Her imaging studies demonstrate widespread disease with near replacement of liver influencing hepatic function and brain metastases, including pulmonary involvement and lymph node involvement.  Given her pathology, Stage IV disease, extent of disease, laboratory findings, and ECOG status, she is not a candidate for chemotherapeutic intervention.  Therefore, Hospice is recommended.    Family is of course emotional with this news.  The patient remains stoic.  I have quoted her a life expectancy of days to weeks.    I provided her brief education regarding Hospice services.  She wishes to go home soon.    I agree with discharge from the hospital to home with Hospice, hopefully tomorrow.   Objective: Vital signs in last 24 hours: Temp:  [97.4 F (36.3 C)-98.2 F (36.8 C)] 97.4 F (36.3 C) (06/11 0505) Pulse Rate:  [67-101] 101 (06/11 0505) Resp:  [16-23] 20 (06/11 0505) BP: (99-109)/(40-71) 109/71 mmHg (06/11 0505) SpO2:  [95 %-97 %] 97 % (06/11 0505) Weight:  [153 lb 10.6 oz (69.7 kg)] 153 lb 10.6 oz (69.7 kg) (06/11 0505)  Intake/Output from previous day:   Intake/Output this shift:    General appearance: alert, cooperative, appears older than stated age, icteric and sick  Lab Results:   Recent Labs  09/18/13 0459 09/19/13 0635  WBC 17.3* 21.2*  HGB 12.6 13.0  HCT 38.0 39.5  PLT 53* 47*   BMET  Recent Labs  09/18/13 0459 09/19/13 0635  NA 138 137  K 3.5* 4.3  CL 95* 96   CO2 27 25  GLUCOSE 123* 132*  BUN 37* 42*  CREATININE 0.88 0.92  CALCIUM 8.2* 8.6    Studies/Results: Ct Chest W Contrast  09/18/2013   CLINICAL DATA:  Abnormal MRI.  Weakness with cough.  EXAM: CT CHEST WITH CONTRAST  TECHNIQUE: Multidetector CT imaging of the chest was performed during intravenous contrast administration.  CONTRAST:  85mL OMNIPAQUE IOHEXOL 300 MG/ML  SOLN  COMPARISON:  Chest x-ray 09/16/2013. Multiple prior studies of the abdomen.  FINDINGS: Bulky mediastinal and right hilar lymphadenopathy. Cross-sectional measurements on image 22 are 33 x 46 mm. 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 Attention to this area of compression if Port-A-Cath placement contemplated. Bilateral level 4 supraclavicular lymph nodes, largest on the right, measuring 13 x 22 mm. Paratracheal adenopathy extends into the superior/anterior mediastinum level 7 along the great vessels on the right.  Postobstructive pneumonitis right upper lobe. Small nodular density on image 22 5 mm anterior to the bulky lymphadenopathy could represent a parenchymal lesion. No other definite pulmonary nodules.  Normal aorta and great vessels. Minor atheromatous change. Normal heart size. No pleural or pericardial effusion. No osseous destructive lesion. Hepatomegaly with multiple metastases. Ascites in the right subphrenic region.  IMPRESSION: Bulky mediastinal and right hilar adenopathy consistent with small cell lung cancer.  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  Postobstructive pneumonitis right upper lobe.  Small medial right upper lobe  nodular density measuring 5 mm.  Widespread mediastinal and lower cervical adenopathy.   Electronically Signed   By: Rolla Flatten M.D.   On: 09/18/2013 21:53   Mr Jeri Cos YS Contrast  09/18/2013   CLINICAL DATA:  New diagnosis small cell carcinoma which is metastatic.  EXAM: MRI HEAD WITHOUT AND WITH CONTRAST  TECHNIQUE:  Multiplanar, multiecho pulse sequences of the brain and surrounding structures were obtained without and with intravenous contrast.  CONTRAST:  58mL MULTIHANCE GADOBENATE DIMEGLUMINE 529 MG/ML IV SOLN  COMPARISON:  MRI 12/24/2007  FINDINGS: The brain shows a background pattern of old small vessel infarctions within the hemispheric white matter. There chronic small vessel changes of the pons. No large vessel territory infarctions.  No metastasis is present in the posterior fossa. In the left occipital lobe axial image 47, there is a 5 x 7 mm metastasis with some central necrosis. No edema or mass effect. In the right frontoparietal junction region at the vertex, there is a 6 x 7 mm metastasis with central necrosis that does not show mass effect or edema. There is a 2 mm metastasis just posterior to the atrium of the right lateral ventricle.  No hydrocephalus or extra-axial collection. No pituitary mass. No skull or skullbase lesion. No inflammatory sinus disease.  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 5 x 7 mm metastasis in the left occipital lobe. 6 x 7 mm metastasis in the right frontoparietal junction region at the vertex. 2 mm metastasis on the right just posterior to the atrium of right lateral ventricle. These are not associated with mass effect or hemorrhage.  Chronic small vessel ischemic changes affecting the brain as outlined above.   Electronically Signed   By: Nelson Chimes M.D.   On: 09/18/2013 11:28   US Biopsy  09/17/2013   CLINICAL DATA:  53 year old with abnormal liver enzymes, jaundice and concern for multiple liver lesions. Tissue diagnosis is needed.  EXAM: ULTRASOUND-GUIDED LIVER BIOPSY ; PARACENTESIS WITH ULTRASOUND GUIDANCE  Physician: Stephan Minister. Anselm Pancoast, MD  FLUOROSCOPY TIME:  None  MEDICATIONS: 150 mcg fentanyl, 3 mg versed. A radiology nurse monitored the patient for moderate sedation.  ANESTHESIA/SEDATION: Moderate sedation time: 35 min  PROCEDURE: The procedure was  explained to the patient. The risks and benefits of the procedure were discussed and the patient's questions were addressed. Informed consent was obtained from the patient. The abdomen was evaluated with ultrasound. Ascites identified in the right perihepatic space. The left hepatic lobe was selected for biopsy. The right anterior abdomen was prepped with Betadine and a sterile field was created. Skin was anesthetized with 1% lidocaine. A Yueh catheter was directed into perihepatic ascites with ultrasound guidance and a paracentesis was performed. 1.2 L of yellow fluid was removed. The ascites resolved by ultrasound and the catheter was removed.  Attention was directed to the liver biopsy. The anterior abdomen was prepped with chlorhexidine and a sterile field was created. The liver is diffusely heterogeneous. The left inferior lobe is prominent and has areas of hypoechogenicity and hyperechogenicity. The skin and soft tissues were anesthetized with 1% lidocaine. A 17 gauge needle was directed into the inferior left hepatic lobe with ultrasound guidance. Two fine needle aspirations were obtained with 22 gauge needles. Three core biopsies were obtained with an 18 gauge core device. Two cores were placed in formalin and one core was placed in saline. A 17 gauge needle was removed without complication. Bandage placed over the puncture site.  FINDINGS: The liver  is diffusely heterogeneous. The inferior left hepatic lobe was biopsied.  Perihepatic ascites.  1.2 L of ascites was removed.  COMPLICATIONS: None  IMPRESSION: Ultrasound-guided fine needle aspirations and core biopsies of the left hepatic lobe.  Ultrasound-guided paracentesis.  1.2 L of fluid was removed.   Electronically Signed   By: Markus Daft M.D.   On: 09/17/2013 17:57   US Paracentesis  09/17/2013   CLINICAL DATA:  53 year old with abnormal liver enzymes, jaundice and concern for multiple liver lesions. Tissue diagnosis is needed.  EXAM:  ULTRASOUND-GUIDED LIVER BIOPSY ; PARACENTESIS WITH ULTRASOUND GUIDANCE  Physician: Stephan Minister. Anselm Pancoast, MD  FLUOROSCOPY TIME:  None  MEDICATIONS: 150 mcg fentanyl, 3 mg versed. A radiology nurse monitored the patient for moderate sedation.  ANESTHESIA/SEDATION: Moderate sedation time: 35 min  PROCEDURE: The procedure was explained to the patient. The risks and benefits of the procedure were discussed and the patient's questions were addressed. Informed consent was obtained from the patient. The abdomen was evaluated with ultrasound. Ascites identified in the right perihepatic space. The left hepatic lobe was selected for biopsy. The right anterior abdomen was prepped with Betadine and a sterile field was created. Skin was anesthetized with 1% lidocaine. A Yueh catheter was directed into perihepatic ascites with ultrasound guidance and a paracentesis was performed. 1.2 L of yellow fluid was removed. The ascites resolved by ultrasound and the catheter was removed.  Attention was directed to the liver biopsy. The anterior abdomen was prepped with chlorhexidine and a sterile field was created. The liver is diffusely heterogeneous. The left inferior lobe is prominent and has areas of hypoechogenicity and hyperechogenicity. The skin and soft tissues were anesthetized with 1% lidocaine. A 17 gauge needle was directed into the inferior left hepatic lobe with ultrasound guidance. Two fine needle aspirations were obtained with 22 gauge needles. Three core biopsies were obtained with an 18 gauge core device. Two cores were placed in formalin and one core was placed in saline. A 17 gauge needle was removed without complication. Bandage placed over the puncture site.  FINDINGS: The liver is diffusely heterogeneous. The inferior left hepatic lobe was biopsied.  Perihepatic ascites.  1.2 L of ascites was removed.  COMPLICATIONS: None  IMPRESSION: Ultrasound-guided fine needle aspirations and core biopsies of the left hepatic lobe.   Ultrasound-guided paracentesis.  1.2 L of fluid was removed.   Electronically Signed   By: Markus Daft M.D.   On: 09/17/2013 17:57    Medications: I have reviewed the patient's current medications.  Assessment/Plan: 1. Stage IV small cell lung cancer, poorly differentiated, with adenopathy, hepatic, and brain involvement.  Patient's prognosis is terminal with days to weeks of life expected.  Impending liver failure is likely. 2. Transaminitis secondary to liver metastases, indication of impending liver failure. 3. H/O tobacco abuse 4. Leukocytosis secondary to #1 5. Thrombocytopenia, secondary to #1 6. Hospice referral made.  Recommend home with Hospice tomorrow. 7. Spiritual care consult, the family and patient can benenfit from support given the patient's terminal diagnosis and short life expectancy. 8. Recommend comfort care only.  Recommend DNR. 9. Transition IV Dexamethasone to PO Dexamethasone to help with symptom control. 10.  Continue pain control. 11. Oncology will sign off.  She reports her Primary Care Provider is Dr. Marcie Bal Dear (OB/GYN).  I have discussed the case with Dr. Dear and updated her on the patient's situation.  She is gracious enough to sign Hospice orders for the patient following discharge from the [facility].  Patient and plan discussed with Dr. Farrel Gobble and he is in agreement with the aforementioned.      LOS: 3 days    Tayjon Halladay 09/19/2013

## 2013-09-19 NOTE — Care Management Note (Addendum)
    Page 1 of 2   09/20/2013     10:16:28 AM CARE MANAGEMENT NOTE 09/20/2013  Patient:  KELSHA, OLDER   Account Number:  [id number]  Date Initiated:  09/19/2013  Documentation initiated by:  Theophilus Kinds  Subjective/Objective Assessment:   Pt admitted from home with abd distention. Pt lives with her husband and will return home at discharge. Pt has been fairly indepedent with ADL's.     Action/Plan:   Pt has new diagnosis of mets ca. Pt chooses [facility] for hospice referral. Referral called and records sent via PL to Hospice. Aware of discharge on 09/20/13.   Anticipated DC Date:  09/20/2013   Anticipated DC Plan:  [place]  CM consult      PAC Choice  HOSPICE   Choice offered to / List presented to:  C-1 Patient   DME arranged  [facility]      DME agency  [facility]        Status of service:  Completed, signed off Medicare Important Message given?   (If response is "NO", the following Medicare IM given date fields will be blank) Date Medicare IM given:   Date Additional Medicare IM given:    Discharge Disposition:  [place]  Per UR Regulation:    If discussed at Long Length of Stay Meetings, dates discussed:    Comments:  09/20/13 [place], RN BSN CM Pt to be discharged home today with [facility]. DME arranged with [facility] and will be delivered to pts home prior to discharge. No other CM needs noted. Pt and pts nurse aware of discharge arrangements.   09/19/13 [place], RN BSN [place] is unable to arrange DME for pt due to insurance restrictions. DME arranged with [facility] per pts husband choice. Will try to qualify pt for home O2. Will continue to follow for discharge planning needs. Pt potential discharge 09/20/13.  09/19/13 [place], RN BSN CM

## 2013-09-19 NOTE — Progress Notes (Signed)
[facility] PROGRESS NOTE  [name] BWL:[id number] DOB: 1960-08-23 DOA: 09/16/2013 PCP: Dear, Trude Mcburney, MD   54 y/o female admitted 09/16/13, know Htn, [place], [place],. ? 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   Dr Laural Golden assistance appreciated.  MRI brain shows 3 mets 5 x 1m, 6 x 769m And 21m74met  MRI abdomen pelvis shows diffuse hepatomegaly + steatosis Ct chest is pending Oncology consulted and appreciate input in advance  Active Problems:  Elevated transaminase level: See #1. Hep B and C Negative. Trending up slightly today.   No history of EtOH or IV drug use. Will check ammonia level.  Biirubin rising  Obtain LDH/Hapto to rule out hemolysis 2/2 to either tumour lysis or Cancer itself Will obtain serial abd girths as might need rpt Paracentesis  Encephalopathy: likely related to worsening liver function secondary to #1.  She is normalizing and seems to understand more clearly options that are available to her  Leukocytosis: trending up somewhat.  Was on IV levaquin d/c earlier this admission [facility]  She is afebrile and non-toxic appearing.  Urine cult shows NG.    Hypokalemia: mild. Will replete and recheck. Magnesium 2.7 on admission. Will recheck.    Code Status: full Family Communication: husband  at bedside Disposition Plan: Unclear.  [facility]  Defers to family.  Understands gravtiy of situation.  Want;s some time to digest all that is going on   Consultants:  Dr. RehLaural Goldenstroenterology  oncology  Procedures:  Paracentesis 09/17/13  Liver biopsy  09/17/13  Antibiotics:  none  HPI/Subjective:  More alert oriented and in NAD.   No n/v/cp no other issues NO confusions today   Objective: Filed Vitals:   09/19/13 0505  BP: 109/71  Pulse: 101  Temp: 97.4 F (36.3 C)  Resp: 20    Intake/Output Summary (Last 24 hours) at 09/19/13 1317 Last data filed at 09/19/13 0900  Gross per 24 hour  Intake      0 ml  Output      0 ml  Net      0 ml   Filed Weights   09/17/13 0500 09/18/13 0500 09/19/13 0505  Weight: 67 kg (147 lb 11.3 oz) 68 kg (149 lb 14.6 oz) 69.7 kg (153 lb 10.6 oz)    Exam:   General:  Well nourished appears comfortable  Cardiovascular: RRR No MGR No LE edema PPP  Respiratory: normal effort somewhat shallow. BS clear bilaterally no wheeze no rhonchi  Abdomen: remains distended and firm. Non tender  Musculoskeletal: joints without swelling/erythema   Data Reviewed: Basic Metabolic Panel:  Recent Labs Lab 09/16/13 1029 09/16/13 1035 09/17/13 0446 09/18/13 0459 09/19/13 0635  NA 140  --  138 138 137  K 3.2*  --  3.1* 3.5* 4.3  CL 91*  --  94* 95* 96  CO2 33*  --  29 27 25   GLUCOSE 100*  --  114* 123* 132*  BUN 27*  --  28* 37* 42*  CREATININE 0.96  --  0.89 0.88 0.92  CALCIUM 8.9  --  8.2* 8.2* 8.6  MG  --  2.7*  --  2.5  --    Liver Function Tests:  Recent Labs Lab 09/16/13 1029 09/17/13 0446 09/18/13 0459 09/19/13 0635  AST 120* 103* 105* 125*  ALT 81* 77* 80* 99*  ALKPHOS 227* 190* 187* 229*  BILITOT 6.6* 5.9* 6.4* 8.1*  PROT 6.2 5.2* 5.3* 5.4*  ALBUMIN 3.2* 2.7* 2.6* 2.7*   No results found for this basename: LIPASE, AMYLASE,  in the last 168 hours  Recent Labs Lab 09/18/13 0933  AMMONIA 69*   CBC:  Recent Labs Lab 09/16/13 1029 09/17/13 0446 09/18/13 0459 09/19/13 0635  WBC 13.1* 13.8* 17.3* 21.2*  NEUTROABS 11.1*  --  15.4*  --   HGB 13.4 12.4 12.6 13.0  HCT 40.4 37.3 38.0 39.5  MCV 93.5 94.4 94.8 95.0  PLT 65* 57* 53* 47*   Cardiac Enzymes: No results  found for this basename: CKTOTAL, CKMB, CKMBINDEX, TROPONINI,  in the last 168 hours BNP (last 3 results) No results found for this basename: PROBNP,  in the last 8760 hours CBG: No results found for this basename: GLUCAP,  in the last 168 hours  Recent Results (from the past 240 hour(s))  URINE CULTURE     Status: None   Collection Time    09/14/13  6:17 PM      Result Value Ref Range Status   Specimen Description URINE, CLEAN CATCH   Final   Special Requests NONE   Final   Culture  Setup Time     Final   Value: 09/15/2013 21:03     Performed at [facility]     Final   Value: 6,000 COLONIES/ML     Performed at [facility]   Culture     Final   Value: INSIGNIFICANT GROWTH     Performed at [facility]   Report Status 09/16/2013 FINAL   Final  MRSA PCR SCREENING     Status: None   Collection Time    09/16/13  1:41 PM      Result Value Ref Range Status   MRSA by PCR NEGATIVE  NEGATIVE Final   Comment:            The GeneXpert MRSA Assay (FDA     approved for NASAL specimens     only), is one component of a     comprehensive MRSA colonization     surveillance program. It is not     intended to diagnose MRSA     infection nor to guide or     monitor treatment for     MRSA infections.  CULTURE, BLOOD (ROUTINE X 2)     Status: None   Collection Time    09/16/13  1:48 PM      Result Value Ref Range Status   Specimen Description BLOOD RIGHT HAND   Final   Special Requests BOTTLES DRAWN AEROBIC AND ANAEROBIC 8CC   Final   Culture NO GROWTH 3 DAYS   Final   Report Status PENDING   Incomplete  CULTURE, BLOOD (ROUTINE X 2)     Status: None   Collection Time    09/16/13  1:53 PM      Result Value Ref Range Status   Specimen Description BLOOD LEFT ANTECUBITAL   Final   Special Requests BOTTLES DRAWN AEROBIC AND ANAEROBIC 10CC   Final   Culture NO GROWTH 3 DAYS   Final   Report Status PENDING   Incomplete  URINE CULTURE  Status: None    Collection Time    09/16/13  4:08 PM      Result Value Ref Range Status   Specimen Description URINE, CLEAN CATCH   Final   Special Requests NONE   Final   Culture  Setup Time     Final   Value: 09/16/2013 23:43     Performed at [facility] Count     Final   Value: NO GROWTH     Performed at [facility]   Culture     Final   Value: NO GROWTH     Performed at [facility]   Report Status 09/17/2013 FINAL   Final  BODY FLUID CULTURE     Status: None   Collection Time    09/17/13  3:17 PM      Result Value Ref Range Status   Specimen Description ASCITIC ABDOMEN FLUID   Final   Special Requests 60ML FLUID   Final   Gram Stain     Final   Value: RARE WBC PRESENT, PREDOMINANTLY MONONUCLEAR     NO ORGANISMS SEEN     Performed at [facility]   Culture     Final   Value: NO GROWTH 2 DAYS     Performed at [facility]   Report Status PENDING   Incomplete  ANAEROBIC CULTURE     Status: None   Collection Time    09/17/13  3:17 PM      Result Value Ref Range Status   Specimen Description ASCITIC ABDOMEN   Final   Special Requests 60 ML   Final   Gram Stain     Final   Value: RARE WBC PRESENT, PREDOMINANTLY MONONUCLEAR     NO ORGANISMS SEEN     Performed at [facility]   Culture     Final   Value: NO ANAEROBES ISOLATED; CULTURE IN PROGRESS FOR 5 DAYS     Performed at [facility]   Report Status PENDING   Incomplete     Studies: Ct Chest W Contrast  09/18/2013   CLINICAL DATA:  Abnormal MRI.  Weakness with cough.  EXAM: CT CHEST WITH CONTRAST  TECHNIQUE: Multidetector CT imaging of the chest was performed during intravenous contrast administration.  CONTRAST:  63m OMNIPAQUE IOHEXOL 300 MG/ML  SOLN  COMPARISON:  Chest x-ray 09/16/2013. Multiple prior studies of the abdomen.  FINDINGS: Bulky mediastinal and right hilar lymphadenopathy. Cross-sectional measurements on image 22 are 33 x 46 mm. 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 Attention to this area of compression if Port-A-Cath placement contemplated. Bilateral level 4 supraclavicular lymph nodes, largest on the right, measuring 13 x 22 mm. Paratracheal adenopathy extends into the superior/anterior mediastinum level 7 along the great vessels on the right.  Postobstructive pneumonitis right upper lobe. Small nodular density on image 22 5 mm anterior to the bulky lymphadenopathy could represent a parenchymal lesion. No other definite pulmonary nodules.  Normal aorta and great vessels. Minor atheromatous change. Normal heart size. No pleural or pericardial effusion. No osseous destructive lesion. Hepatomegaly with multiple metastases. Ascites in the right subphrenic region.  IMPRESSION: Bulky mediastinal and right hilar adenopathy consistent with small cell lung cancer.  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  Postobstructive pneumonitis right upper lobe.  Small medial right upper lobe nodular density measuring 5 mm.  Widespread mediastinal and  lower cervical adenopathy.   Electronically Signed   By: Rolla Flatten M.D.   On: 09/18/2013 21:53   Mr Jeri Cos ME Contrast  09/18/2013   CLINICAL DATA:  New diagnosis small cell carcinoma which is metastatic.  EXAM: MRI HEAD WITHOUT AND WITH CONTRAST  TECHNIQUE: Multiplanar, multiecho pulse sequences of the brain and surrounding structures were obtained without and with intravenous contrast.  CONTRAST:  30m MULTIHANCE GADOBENATE DIMEGLUMINE 529 MG/ML IV SOLN  COMPARISON:  MRI 12/24/2007  FINDINGS: The brain shows a background pattern of old small vessel infarctions within the hemispheric white matter. There chronic small vessel changes of the pons. No large vessel territory infarctions.  No metastasis is present in the posterior fossa. In the left occipital lobe axial image 47, there is a 5 x 7 mm metastasis with some central necrosis. No edema or mass effect.  In the right frontoparietal junction region at the vertex, there is a 6 x 7 mm metastasis with central necrosis that does not show mass effect or edema. There is a 2 mm metastasis just posterior to the atrium of the right lateral ventricle.  No hydrocephalus or extra-axial collection. No pituitary mass. No skull or skullbase lesion. No inflammatory sinus disease.  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 5 x 7 mm metastasis in the left occipital lobe. 6 x 7 mm metastasis in the right frontoparietal junction region at the vertex. 2 mm metastasis on the right just posterior to the atrium of right lateral ventricle. These are not associated with mass effect or hemorrhage.  Chronic small vessel ischemic changes affecting the brain as outlined above.   Electronically Signed   By: MNelson ChimesM.D.   On: 09/18/2013 11:28   [place]  09/17/2013   CLINICAL DATA:  53year old with abnormal liver enzymes, jaundice and concern for multiple liver lesions. Tissue diagnosis is needed.  EXAM: ULTRASOUND-GUIDED LIVER BIOPSY ; PARACENTESIS WITH ULTRASOUND GUIDANCE  Physician: AStephan Minister HAnselm Pancoast MD  FLUOROSCOPY TIME:  None  MEDICATIONS: 150 mcg fentanyl, 3 mg versed. A radiology nurse monitored the patient for moderate sedation.  ANESTHESIA/SEDATION: Moderate sedation time: 35 min  PROCEDURE: The procedure was explained to the patient. The risks and benefits of the procedure were discussed and the patient's questions were addressed. Informed consent was obtained from the patient. The abdomen was evaluated with ultrasound. Ascites identified in the right perihepatic space. The left hepatic lobe was selected for biopsy. The right anterior abdomen was prepped with Betadine and a sterile field was created. Skin was anesthetized with 1% lidocaine. A Yueh catheter was directed into perihepatic ascites with ultrasound guidance and a paracentesis was performed. 1.2 L of yellow fluid was removed. The ascites resolved by  ultrasound and the catheter was removed.  Attention was directed to the liver biopsy. The anterior abdomen was prepped with chlorhexidine and a sterile field was created. The liver is diffusely heterogeneous. The left inferior lobe is prominent and has areas of hypoechogenicity and hyperechogenicity. The skin and soft tissues were anesthetized with 1% lidocaine. A 17 gauge needle was directed into the inferior left hepatic lobe with ultrasound guidance. Two fine needle aspirations were obtained with 22 gauge needles. Three core biopsies were obtained with an 18 gauge core device. Two cores were placed in formalin and one core was placed in saline. A 17 gauge needle was removed without complication. Bandage placed over the puncture site.  FINDINGS: The liver is diffusely heterogeneous. The inferior left hepatic lobe was  biopsied.  Perihepatic ascites.  1.2 L of ascites was removed.  COMPLICATIONS: None  IMPRESSION: Ultrasound-guided fine needle aspirations and core biopsies of the left hepatic lobe.  Ultrasound-guided paracentesis.  1.2 L of fluid was removed.   Electronically Signed   By: Markus Daft M.D.   On: 09/17/2013 17:57   US Paracentesis  09/17/2013   CLINICAL DATA:  53 year old with abnormal liver enzymes, jaundice and concern for multiple liver lesions. Tissue diagnosis is needed.  EXAM: ULTRASOUND-GUIDED LIVER BIOPSY ; PARACENTESIS WITH ULTRASOUND GUIDANCE  Physician: Stephan Minister. Anselm Pancoast, MD  FLUOROSCOPY TIME:  None  MEDICATIONS: 150 mcg fentanyl, 3 mg versed. A radiology nurse monitored the patient for moderate sedation.  ANESTHESIA/SEDATION: Moderate sedation time: 35 min  PROCEDURE: The procedure was explained to the patient. The risks and benefits of the procedure were discussed and the patient's questions were addressed. Informed consent was obtained from the patient. The abdomen was evaluated with ultrasound. Ascites identified in the right perihepatic space. The left hepatic lobe was selected for biopsy.  The right anterior abdomen was prepped with Betadine and a sterile field was created. Skin was anesthetized with 1% lidocaine. A Yueh catheter was directed into perihepatic ascites with ultrasound guidance and a paracentesis was performed. 1.2 L of yellow fluid was removed. The ascites resolved by ultrasound and the catheter was removed.  Attention was directed to the liver biopsy. The anterior abdomen was prepped with chlorhexidine and a sterile field was created. The liver is diffusely heterogeneous. The left inferior lobe is prominent and has areas of hypoechogenicity and hyperechogenicity. The skin and soft tissues were anesthetized with 1% lidocaine. A 17 gauge needle was directed into the inferior left hepatic lobe with ultrasound guidance. Two fine needle aspirations were obtained with 22 gauge needles. Three core biopsies were obtained with an 18 gauge core device. Two cores were placed in formalin and one core was placed in saline. A 17 gauge needle was removed without complication. Bandage placed over the puncture site.  FINDINGS: The liver is diffusely heterogeneous. The inferior left hepatic lobe was biopsied.  Perihepatic ascites.  1.2 L of ascites was removed.  COMPLICATIONS: None  IMPRESSION: Ultrasound-guided fine needle aspirations and core biopsies of the left hepatic lobe.  Ultrasound-guided paracentesis.  1.2 L of fluid was removed.   Electronically Signed   By: Markus Daft M.D.   On: 09/17/2013 17:57    Scheduled Meds: . dexamethasone  8 mg Intravenous 3 times per day  . pantoprazole (PROTONIX) IV  40 mg Intravenous Q12H   Continuous Infusions:   Principal Problem:   Abdominal distension Active Problems:   Essential hypertension, benign   High cholesterol   Ascites   Elevated transaminase level   Leukocytosis   Hypokalemia   Thrombocytopenia   Small cell carcinoma   Acute encephalopathy    Time spent: 25 minutes  Verneita Griffes, MD [facility] (P)  [phone]

## 2013-09-20 ENCOUNTER — Ambulatory Visit (HOSPITAL_COMMUNITY): Payer: 59

## 2013-09-20 MED ORDER — PANTOPRAZOLE SODIUM 40 MG PO TBEC: 40.0000 mg | DELAYED_RELEASE_TABLET | Freq: Two times a day (BID) | ORAL | Status: DC

## 2013-09-20 MED ORDER — OXYCODONE HCL 5 MG PO TABS: 5.0000 mg | ORAL_TABLET | ORAL | Status: DC | PRN

## 2013-09-20 MED ORDER — LORAZEPAM 2 MG/ML PO CONC: 1.0000 mg | ORAL | Status: AC | PRN

## 2013-09-20 MED ORDER — OXYCODONE HCL 5 MG PO TABS: 5.0000 mg | ORAL_TABLET | ORAL | Status: AC | PRN

## 2013-09-20 NOTE — Discharge Summary (Signed)
Physician Discharge Summary  RANEEN JAFFER KGY:[id number] DOB: 06-Feb-1961 DOA: 09/16/2013  PCP: Dear, Trude Mcburney, MD  Admit date: 09/16/2013 Discharge date: 09/20/2013  Time spent: 35 minutes  Recommendations for Outpatient Follow-up:  1. Home hospice requested for patient to be discharged with 2. Full hospice package and supplies to be delivered to home-may require oxygen   Discharge Diagnoses:  Principal Problem:   Abdominal distension Active Problems:   Essential hypertension, benign   High cholesterol   Ascites   Elevated transaminase level   Leukocytosis   Hypokalemia   Thrombocytopenia   Small cell carcinoma   Acute encephalopathy   Discharge Condition: Guarded  Diet recommendation: Comfort feeds  Filed Weights   09/18/13 0500 09/19/13 0505 09/20/13 0522  Weight: 68 kg (149 lb 14.6 oz) 69.7 kg (153 lb 10.6 oz) 69.4 kg (153 lb)    History of present illness:  53 y/o female admitted 09/16/13, know Htn, GErd, HLD,. ? Of Multiple sclerosis on admission 12/25/2007.  she was seen earlier this month by gastroenterology because of dysphagia--because of new onset abdominal swelling  seen in the emergency room 09/14/13 then discharged home as there is no evidence of spontaneous bacterial peritonitis  she returned to emergency room 09/16/13 with abdominal pain and progressive swelling and distension in a setting of dysphagia with difficulty swallowing She was found to have moderate to extended ascites and had a liver biopsyby interventional radiology  that revealed small cell carcinoma. Paracentesis was done on 09/17/13. Further workup and staging showed MRI brain showing 3 metastatic areas and CT chest showed endobronchial lesion as well. She had elevation of transaminases, as well as hyperbilirubinemia  Her leukocytosis she also presented with leukocytosis I was not really responsive to IV antibiotics and was thought that this is part and parcel of her tumor syndrome in addition to her  thrombocytopenia. Oncology saw the patientand recommended hospice care and patient will be discharged home with full hospice support, oxygen, by mouth Percocet/oxycodone and sublingual Ativan  condolences expressed to family   Discharge Exam: Filed Vitals:   09/20/13 0522  BP: 107/51  Pulse: 81  Temp: 97.6 F (36.4 C)  Resp: 20    General:  alert pleasant oriented  Cardiovascular:  S1-S2 no murmur rub or gallop  Respiratory:  clinically clear   gross distention or abdomen nontender however  Discharge Instructions You were cared for by a hospitalist during your hospital stay. If you have any questions about your discharge medications or the care you received while you were in the hospital after you are discharged, you can call the unit and asked to speak with the hospitalist on call if the hospitalist that took care of you is not available. Once you are discharged, your primary care physician will handle any further medical issues. Please note that NO REFILLS for any discharge medications will be authorized once you are discharged, as it is imperative that you return to your primary care physician (or establish a relationship with a primary care physician if you do not have one) for your aftercare needs so that they can reassess your need for medications and monitor your lab values.  Discharge Instructions   Diet - low sodium heart healthy    Complete by:  As directed      Discharge instructions    Complete by:  As directed   Hospice to follow at home     Increase activity slowly    Complete by:  As directed  Medication List    STOP taking these medications       lisinopril 10 MG tablet  Commonly known as:  PRINIVIL,ZESTRIL     simvastatin 20 MG tablet  Commonly known as:  ZOCOR     Vitamin D (Ergocalciferol) 50000 UNITS Caps capsule  Commonly known as:  DRISDOL      TAKE these medications       alum & mag hydroxide-simeth 200-200-20 MG/5ML suspension   Commonly known as:  MAALOX/MYLANTA  Take 15 mLs by mouth every 6 (six) hours as needed for indigestion or heartburn.     citalopram 10 MG tablet  Commonly known as:  CELEXA  Take 10 mg by mouth daily.     esomeprazole 40 MG capsule  Commonly known as:  NEXIUM  Take 40 mg by mouth daily.     ibuprofen 200 MG tablet  Commonly known as:  ADVIL,MOTRIN  Take 400 mg by mouth daily as needed for headache.     LORazepam 2 MG/ML concentrated solution  Commonly known as:  ATIVAN  Take 0.5 mLs (1 mg total) by mouth every 4 (four) hours as needed for anxiety.     oxyCODONE 5 MG immediate release tablet  Commonly known as:  Oxy IR/ROXICODONE  Take 1 tablet (5 mg total) by mouth every 3 (three) hours as needed for severe pain.     promethazine 12.5 MG tablet  Commonly known as:  PHENERGAN  Take 12.5 mg by mouth every 6 (six) hours as needed. nausea     spironolactone 25 MG tablet  Commonly known as:  ALDACTONE  Take 1 tablet (25 mg total) by mouth daily.     traZODone 100 MG tablet  Commonly known as:  DESYREL  Take 50-100 mg by mouth at bedtime.       No Known Allergies    The results of significant diagnostics from this hospitalization (including imaging, microbiology, ancillary and laboratory) are listed below for reference.    Significant Diagnostic Studies: Ct Chest W Contrast  09/18/2013   CLINICAL DATA:  Abnormal MRI.  Weakness with cough.  EXAM: CT CHEST WITH CONTRAST  TECHNIQUE: Multidetector CT imaging of the chest was performed during intravenous contrast administration.  CONTRAST:  75mL OMNIPAQUE IOHEXOL 300 MG/ML  SOLN  COMPARISON:  Chest x-ray 09/16/2013. Multiple prior studies of the abdomen.  FINDINGS: Bulky mediastinal and right hilar lymphadenopathy. Cross-sectional measurements on image 22 are 33 x 46 mm. 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 Attention to this area of compression if Port-A-Cath placement contemplated.  Bilateral level 4 supraclavicular lymph nodes, largest on the right, measuring 13 x 22 mm. Paratracheal adenopathy extends into the superior/anterior mediastinum level 7 along the great vessels on the right.  Postobstructive pneumonitis right upper lobe. Small nodular density on image 22 5 mm anterior to the bulky lymphadenopathy could represent a parenchymal lesion. No other definite pulmonary nodules.  Normal aorta and great vessels. Minor atheromatous change. Normal heart size. No pleural or pericardial effusion. No osseous destructive lesion. Hepatomegaly with multiple metastases. Ascites in the right subphrenic region.  IMPRESSION: Bulky mediastinal and right hilar adenopathy consistent with small cell lung cancer.  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  Postobstructive pneumonitis right upper lobe.  Small medial right upper lobe nodular density measuring 5 mm.  Widespread mediastinal and lower cervical adenopathy.   Electronically Signed   By: Michae Kava.D.  On: 09/18/2013 21:53   Mr Jeri Cos QA Contrast  09/18/2013   CLINICAL DATA:  New diagnosis small cell carcinoma which is metastatic.  EXAM: MRI HEAD WITHOUT AND WITH CONTRAST  TECHNIQUE: Multiplanar, multiecho pulse sequences of the brain and surrounding structures were obtained without and with intravenous contrast.  CONTRAST:  89mL MULTIHANCE GADOBENATE DIMEGLUMINE 529 MG/ML IV SOLN  COMPARISON:  MRI 12/24/2007  FINDINGS: The brain shows a background pattern of old small vessel infarctions within the hemispheric white matter. There chronic small vessel changes of the pons. No large vessel territory infarctions.  No metastasis is present in the posterior fossa. In the left occipital lobe axial image 47, there is a 5 x 7 mm metastasis with some central necrosis. No edema or mass effect. In the right frontoparietal junction region at the vertex, there is a 6 x 7 mm metastasis with central necrosis that does not show mass  effect or edema. There is a 2 mm metastasis just posterior to the atrium of the right lateral ventricle.  No hydrocephalus or extra-axial collection. No pituitary mass. No skull or skullbase lesion. No inflammatory sinus disease.  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 5 x 7 mm metastasis in the left occipital lobe. 6 x 7 mm metastasis in the right frontoparietal junction region at the vertex. 2 mm metastasis on the right just posterior to the atrium of right lateral ventricle. These are not associated with mass effect or hemorrhage.  Chronic small vessel ischemic changes affecting the brain as outlined above.   Electronically Signed   By: Nelson Chimes M.D.   On: 09/18/2013 11:28   Mr Abdomen W Wo Contrast  09/17/2013   CLINICAL DATA:  Abdominal pain with ascites and liver lesions on CT. No history of malignancy.  EXAM: MRI ABDOMEN WITHOUT AND WITH CONTRAST  TECHNIQUE: Multiplanar multisequence MR imaging of the abdomen was performed both before and after the administration of intravenous contrast.  CONTRAST:  38mL MULTIHANCE GADOBENATE DIMEGLUMINE 529 MG/ML IV SOLN  COMPARISON:  Abdominal CT 09/11/2013. Limited ultrasound 09/16/2013.  FINDINGS: The liver is enlarged, measuring 15.9 x 25.3 x 24.8 cm. As demonstrated on CT, the liver is diffusely heterogeneous in signal. There are multiple areas of heterogeneous T2 hyperintensity and T1 hypo intensity. Some of these are peripheral and wedge-shaped, although others are rounded. There is associated restricted diffusion. No typical loss of signal on the gradient echo opposed phase imaging is demonstrated to suggest focal fat. Post-contrast, there is some ring enhancement of many of the smaller rounded lesions.  The hepatic and portal veins are patent. Hepatic vessels course through some of the lesions. There is extrinsic mass effect on the hepatic veins and IVC. The left periumbilical vein appears recanalized. No varices are demonstrated. There  is a moderate amount of ascites.  The spleen is normal in size without focal abnormality. Several prominent lymph nodes are present within the porta hepatis. The coronal images also demonstrate right hilar and subcarinal lymphadenopathy.  There is gallbladder wall edema without focal fluid collection or biliary dilatation. The pancreas, adrenal glands and kidneys appear unremarkable. There are probable scattered hemangiomas within the spine.  IMPRESSION: 1. Diffuse abnormality of the liver associated with hepatomegaly, heterogeneous signal and enhancement. Features are nonspecific and not typical for hepatic steatosis. I believe the findings are most consistent with an infiltrative/inflammatory process, potentially hepatic inflammatory pseudotumor or sarcoidosis given the associated adenopathy in the right hilum, mediastinum and porta hepatis. However, infiltrative neoplasm cannot  be completely excluded. Tissue sampling recommended. 2. Associated ascites and gallbladder wall edema, attributed to adjacent liver disease/hypoproteinemia. No typical findings of portal hypertension. 3. Findings have been discussed by telephone with Dr. Laural Golden.   Electronically Signed   By: Camie Patience M.D.   On: 09/17/2013 09:11   Ct Abdomen Pelvis W Contrast  09/11/2013   CLINICAL DATA:  Abdominal distention and bloating.  EXAM: CT ABDOMEN AND PELVIS WITH CONTRAST  TECHNIQUE: Multidetector CT imaging of the abdomen and pelvis was performed using the standard protocol following bolus administration of intravenous contrast.  CONTRAST:  168mL OMNIPAQUE IOHEXOL 300 MG/ML  SOLN  COMPARISON:  None.  FINDINGS: There is evidence of significant parenchymal liver disease with the liver demonstrating irregular areas of low attenuation, very heterogeneous density and cirrhotic changes with recanalization of the umbilical vein. Areas of low attenuation may relate to irregular fat deposition. However, the CT appearance is concerning for potential  underlying hepatic lesions. Recommend further workup with MRI of the abdomen pre and post gadolinium administration.  A mild amount of perihepatic ascites is also identified as well ascites in the lower abdomen and pelvis of relatively small volume. No obvious focal hepatic mass or evidence of intrahepatic biliary ductal dilatation. There is no evidence of portal vein thrombus or splenic vein thrombosis. No varices are visualized.  The spleen is not enlarged. Some ascites is seen around a contracted gallbladder. The pancreas, adrenal glands and kidneys are unremarkable. No enlarged lymph nodes are seen.  Bowel loops show normal caliber and no evidence of obstruction or inflammation. Atherosclerosis of the abdominal aorta noted without evidence of aneurysm. No hernias are seen. The bladder is unremarkable by CT. There is a probable fundal fibroid emanating off of the right superior fundus of the uterus.  IMPRESSION: Very heterogeneous appearance of the liver and cirrhotic changes. The heterogeneous appearance is concerning for potential underlying multifocal hepatic lesions. Some of these areas may relate to irregular fat deposition. Recommend further workup with MRI of the abdomen pre and post gadolinium administration. Correlation suggested with liver function tests and AFP level. Formal GI/hepatology workup would be indicated. There is associated small amount of ascites.   Electronically Signed   By: Aletta Edouard M.D.   On: 09/11/2013 15:46   US Abdomen Limited  09/16/2013   CLINICAL DATA:  Ascites.  Possible paracentesis.  EXAM: LIMITED ABDOMEN ULTRASOUND FOR ASCITES  TECHNIQUE: Limited ultrasound survey for ascites was performed in all four abdominal quadrants.  COMPARISON:  CT abdomen pelvis 09/11/2013.  FINDINGS: There is a small amount of fluid in the right upper quadrant. Minimal fluid in the lower quadrants bilaterally.  IMPRESSION: Small ascites.  Paracentesis not performed.   Electronically Signed    By: Lorin Picket M.D.   On: 09/16/2013 16:04   US Biopsy  09/17/2013   CLINICAL DATA:  53 year old with abnormal liver enzymes, jaundice and concern for multiple liver lesions. Tissue diagnosis is needed.  EXAM: ULTRASOUND-GUIDED LIVER BIOPSY ; PARACENTESIS WITH ULTRASOUND GUIDANCE  Physician: Stephan Minister. Anselm Pancoast, MD  FLUOROSCOPY TIME:  None  MEDICATIONS: 150 mcg fentanyl, 3 mg versed. A radiology nurse monitored the patient for moderate sedation.  ANESTHESIA/SEDATION: Moderate sedation time: 35 min  PROCEDURE: The procedure was explained to the patient. The risks and benefits of the procedure were discussed and the patient's questions were addressed. Informed consent was obtained from the patient. The abdomen was evaluated with ultrasound. Ascites identified in the right perihepatic space. The left hepatic lobe was  selected for biopsy. The right anterior abdomen was prepped with Betadine and a sterile field was created. Skin was anesthetized with 1% lidocaine. A Yueh catheter was directed into perihepatic ascites with ultrasound guidance and a paracentesis was performed. 1.2 L of yellow fluid was removed. The ascites resolved by ultrasound and the catheter was removed.  Attention was directed to the liver biopsy. The anterior abdomen was prepped with chlorhexidine and a sterile field was created. The liver is diffusely heterogeneous. The left inferior lobe is prominent and has areas of hypoechogenicity and hyperechogenicity. The skin and soft tissues were anesthetized with 1% lidocaine. A 17 gauge needle was directed into the inferior left hepatic lobe with ultrasound guidance. Two fine needle aspirations were obtained with 22 gauge needles. Three core biopsies were obtained with an 18 gauge core device. Two cores were placed in formalin and one core was placed in saline. A 17 gauge needle was removed without complication. Bandage placed over the puncture site.  FINDINGS: The liver is diffusely heterogeneous. The  inferior left hepatic lobe was biopsied.  Perihepatic ascites.  1.2 L of ascites was removed.  COMPLICATIONS: None  IMPRESSION: Ultrasound-guided fine needle aspirations and core biopsies of the left hepatic lobe.  Ultrasound-guided paracentesis.  1.2 L of fluid was removed.   Electronically Signed   By: Markus Daft M.D.   On: 09/17/2013 17:57   US Paracentesis  09/17/2013   CLINICAL DATA:  53 year old with abnormal liver enzymes, jaundice and concern for multiple liver lesions. Tissue diagnosis is needed.  EXAM: ULTRASOUND-GUIDED LIVER BIOPSY ; PARACENTESIS WITH ULTRASOUND GUIDANCE  Physician: Stephan Minister. Anselm Pancoast, MD  FLUOROSCOPY TIME:  None  MEDICATIONS: 150 mcg fentanyl, 3 mg versed. A radiology nurse monitored the patient for moderate sedation.  ANESTHESIA/SEDATION: Moderate sedation time: 35 min  PROCEDURE: The procedure was explained to the patient. The risks and benefits of the procedure were discussed and the patient's questions were addressed. Informed consent was obtained from the patient. The abdomen was evaluated with ultrasound. Ascites identified in the right perihepatic space. The left hepatic lobe was selected for biopsy. The right anterior abdomen was prepped with Betadine and a sterile field was created. Skin was anesthetized with 1% lidocaine. A Yueh catheter was directed into perihepatic ascites with ultrasound guidance and a paracentesis was performed. 1.2 L of yellow fluid was removed. The ascites resolved by ultrasound and the catheter was removed.  Attention was directed to the liver biopsy. The anterior abdomen was prepped with chlorhexidine and a sterile field was created. The liver is diffusely heterogeneous. The left inferior lobe is prominent and has areas of hypoechogenicity and hyperechogenicity. The skin and soft tissues were anesthetized with 1% lidocaine. A 17 gauge needle was directed into the inferior left hepatic lobe with ultrasound guidance. Two fine needle aspirations were obtained  with 22 gauge needles. Three core biopsies were obtained with an 18 gauge core device. Two cores were placed in formalin and one core was placed in saline. A 17 gauge needle was removed without complication. Bandage placed over the puncture site.  FINDINGS: The liver is diffusely heterogeneous. The inferior left hepatic lobe was biopsied.  Perihepatic ascites.  1.2 L of ascites was removed.  COMPLICATIONS: None  IMPRESSION: Ultrasound-guided fine needle aspirations and core biopsies of the left hepatic lobe.  Ultrasound-guided paracentesis.  1.2 L of fluid was removed.   Electronically Signed   By: Markus Daft M.D.   On: 09/17/2013 17:57   Dg Chest [facility]  09/16/2013   CLINICAL DATA:  53 year old female with leukocytosis. Initial encounter.  EXAM: PORTABLE CHEST - 1 VIEW  COMPARISON:  CT Abdomen and Pelvis 09/11/2013.  FINDINGS: Portable AP upright view at 1418 hrs. Mildly lordotic view. Normal cardiac size and mediastinal contours. Visualized tracheal air column is within normal limits. Allowing for portable technique, the lungs are clear. No pneumothorax or pleural effusion identified.  IMPRESSION: No acute cardiopulmonary abnormality.   Electronically Signed   By: Lars Pinks M.D.   On: 09/16/2013 14:25    Microbiology: Recent Results (from the past 240 hour(s))  URINE CULTURE     Status: None   Collection Time    09/14/13  6:17 PM      Result Value Ref Range Status   Specimen Description URINE, CLEAN CATCH   Final   Special Requests NONE   Final   Culture  Setup Time     Final   Value: 09/15/2013 21:03     Performed at [place]     Final   Value: 6,000 COLONIES/ML     Performed at [facility]   Culture     Final   Value: INSIGNIFICANT GROWTH     Performed at [facility]   Report Status 09/16/2013 FINAL   Final  MRSA PCR SCREENING     Status: None   Collection Time    09/16/13  1:41 PM      Result Value Ref Range Status   MRSA by PCR  NEGATIVE  NEGATIVE Final   Comment:            The GeneXpert MRSA Assay (FDA     approved for NASAL specimens     only), is one component of a     comprehensive MRSA colonization     surveillance program. It is not     intended to diagnose MRSA     infection nor to guide or     monitor treatment for     MRSA infections.  CULTURE, BLOOD (ROUTINE X 2)     Status: None   Collection Time    09/16/13  1:48 PM      Result Value Ref Range Status   Specimen Description BLOOD RIGHT HAND   Final   Special Requests BOTTLES DRAWN AEROBIC AND ANAEROBIC 8CC   Final   Culture NO GROWTH 3 DAYS   Final   Report Status PENDING   Incomplete  CULTURE, BLOOD (ROUTINE X 2)     Status: None   Collection Time    09/16/13  1:53 PM      Result Value Ref Range Status   Specimen Description BLOOD LEFT ANTECUBITAL   Final   Special Requests BOTTLES DRAWN AEROBIC AND ANAEROBIC 10CC   Final   Culture NO GROWTH 3 DAYS   Final   Report Status PENDING   Incomplete  URINE CULTURE     Status: None   Collection Time    09/16/13  4:08 PM      Result Value Ref Range Status   Specimen Description URINE, CLEAN CATCH   Final   Special Requests NONE   Final   Culture  Setup Time     Final   Value: 09/16/2013 23:43     Performed at [place]     Final   Value: NO GROWTH     Performed at [facility]  Final   Value: NO GROWTH     Performed at [facility]   Report Status 09/17/2013 FINAL   Final  BODY FLUID CULTURE     Status: None   Collection Time    09/17/13  3:17 PM      Result Value Ref Range Status   Specimen Description ASCITIC ABDOMEN FLUID   Final   Special Requests 60ML FLUID   Final   Gram Stain     Final   Value: RARE WBC PRESENT, PREDOMINANTLY MONONUCLEAR     NO ORGANISMS SEEN     Performed at [facility]   Culture     Final   Value: NO GROWTH 2 DAYS     Performed at [facility]   Report Status PENDING   Incomplete   ANAEROBIC CULTURE     Status: None   Collection Time    09/17/13  3:17 PM      Result Value Ref Range Status   Specimen Description ASCITIC ABDOMEN   Final   Special Requests 60 ML   Final   Gram Stain     Final   Value: RARE WBC PRESENT, PREDOMINANTLY MONONUCLEAR     NO ORGANISMS SEEN     Performed at [facility]   Culture     Final   Value: NO ANAEROBES ISOLATED; CULTURE IN PROGRESS FOR 5 DAYS     Performed at [facility]   Report Status PENDING   Incomplete     Labs: Basic Metabolic Panel:  Recent Labs Lab 09/16/13 1029 09/16/13 1035 09/17/13 0446 09/18/13 0459 09/19/13 0635  NA 140  --  138 138 137  K 3.2*  --  3.1* 3.5* 4.3  CL 91*  --  94* 95* 96  CO2 33*  --  29 27 25   GLUCOSE 100*  --  114* 123* 132*  BUN 27*  --  28* 37* 42*  CREATININE 0.96  --  0.89 0.88 0.92  CALCIUM 8.9  --  8.2* 8.2* 8.6  MG  --  2.7*  --  2.5  --    Liver Function Tests:  Recent Labs Lab 09/16/13 1029 09/17/13 0446 09/18/13 0459 09/19/13 0635  AST 120* 103* 105* 125*  ALT 81* 77* 80* 99*  ALKPHOS 227* 190* 187* 229*  BILITOT 6.6* 5.9* 6.4* 8.1*  PROT 6.2 5.2* 5.3* 5.4*  ALBUMIN 3.2* 2.7* 2.6* 2.7*   No results found for this basename: LIPASE, AMYLASE,  in the last 168 hours  Recent Labs Lab 09/18/13 0933  AMMONIA 69*   CBC:  Recent Labs Lab 09/16/13 1029 09/17/13 0446 09/18/13 0459 09/19/13 0635  WBC 13.1* 13.8* 17.3* 21.2*  NEUTROABS 11.1*  --  15.4*  --   HGB 13.4 12.4 12.6 13.0  HCT 40.4 37.3 38.0 39.5  MCV 93.5 94.4 94.8 95.0  PLT 65* 57* 53* 47*   Cardiac Enzymes: No results found for this basename: CKTOTAL, CKMB, CKMBINDEX, TROPONINI,  in the last 168 hours BNP: BNP (last 3 results) No results found for this basename: PROBNP,  in the last 8760 hours CBG: No results found for this basename: GLUCAP,  in the last 168 hours     Signed:  Nita Sells  [facility] 09/20/2013, 10:48 AM

## 2013-09-20 NOTE — Progress Notes (Signed)
The patient is receiving Protonix by the intravenous route.  Based on criteria approved by the Pharmacy and [place], the medication is being converted to the equivalent oral dose form.  These criteria include: -No Active GI bleeding -Able to tolerate diet of full liquids (or better) or tube feeding OR able to tolerate other medications by the oral or enteral route  If you have any questions about this conversion, please contact the Pharmacy Department (ext [phone]).  Thank you.  Pricilla Larsson, [facility] 09/20/2013 9:42 AM

## 2013-09-20 NOTE — Progress Notes (Signed)
UR chart review completed.  

## 2013-09-21 LAB — BODY FLUID CULTURE: CULTURE: NO GROWTH

## 2013-09-21 LAB — CULTURE, BLOOD (ROUTINE X 2)
CULTURE: NO GROWTH
Culture: NO GROWTH

## 2013-09-21 NOTE — Progress Notes (Signed)
Discharged home with instructions given on medications,and follow up appointments,patient,and family verbalized understanding. [facility] were referrals.Vital signs stable.No further needs noted at this time.Accompanied by staff to an awaiting vehicle.

## 2013-09-22 LAB — ANAEROBIC CULTURE: Special Requests: 60

## 2013-09-27 ENCOUNTER — Ambulatory Visit (HOSPITAL_COMMUNITY): Admission: RE | Admit: 2013-09-27 | Payer: 59 | Source: Ambulatory Visit | Admitting: Internal Medicine

## 2013-09-27 SURGERY — EGD (ESOPHAGOGASTRODUODENOSCOPY)
Anesthesia: Moderate Sedation

## 2013-10-09 DEATH — deceased

## 2014-11-13 IMAGING — US US ABDOMEN LIMITED
1 series · 4 of 4 positions shown · non-contrast
Comparison: CT abdomen pelvis 09/11/2013.

CLINICAL DATA: Ascites.  Possible paracentesis.

EXAM:
LIMITED ABDOMEN ULTRASOUND FOR ASCITES
TECHNIQUE: Limited ultrasound survey for ascites was performed in all four
abdominal quadrants.

[Series 1: us abdomen limited · 0.26mm/px · 4 of 4 slices shown]
[im 1/4]
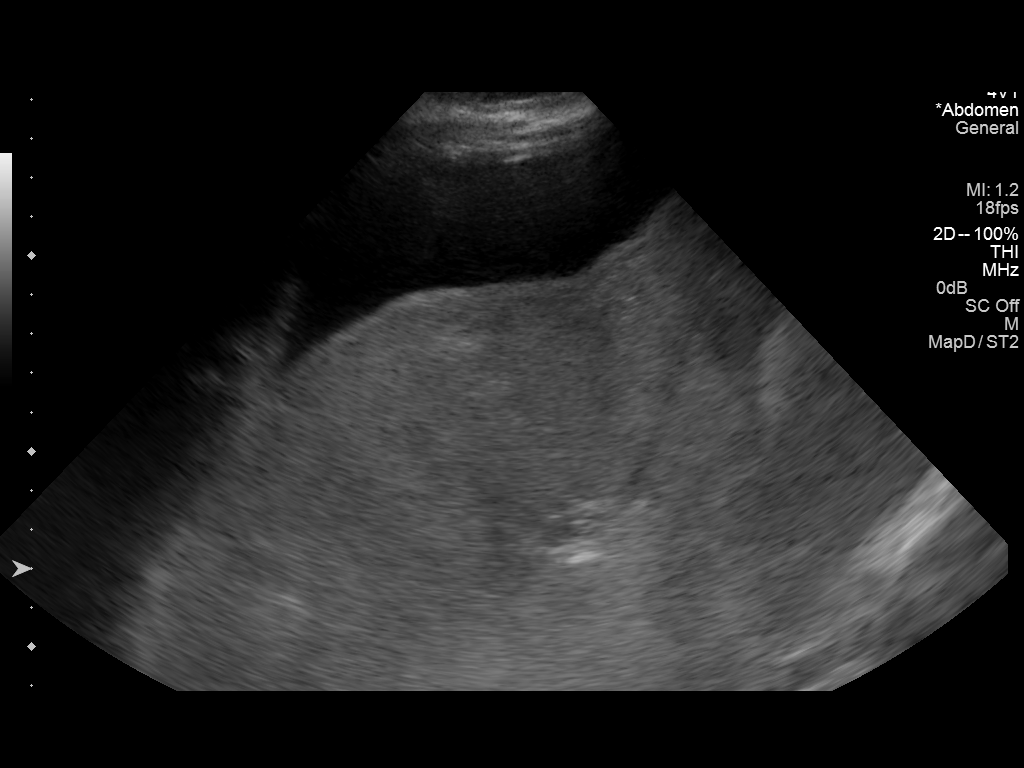
[im 2/4]
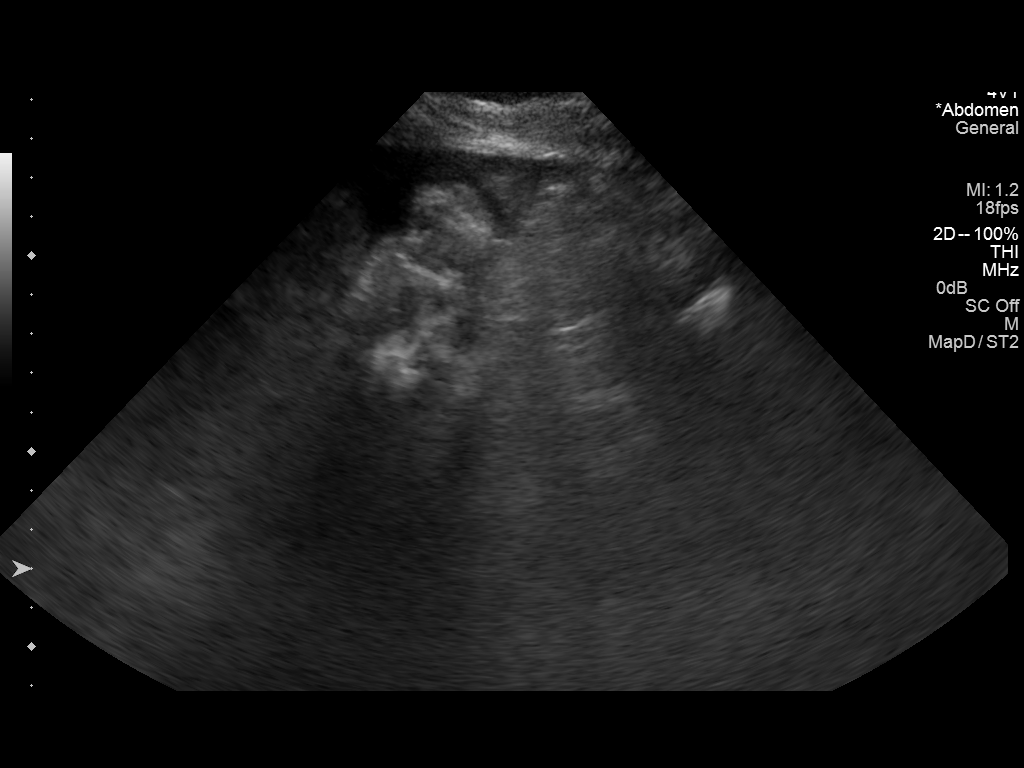
[im 3/4]
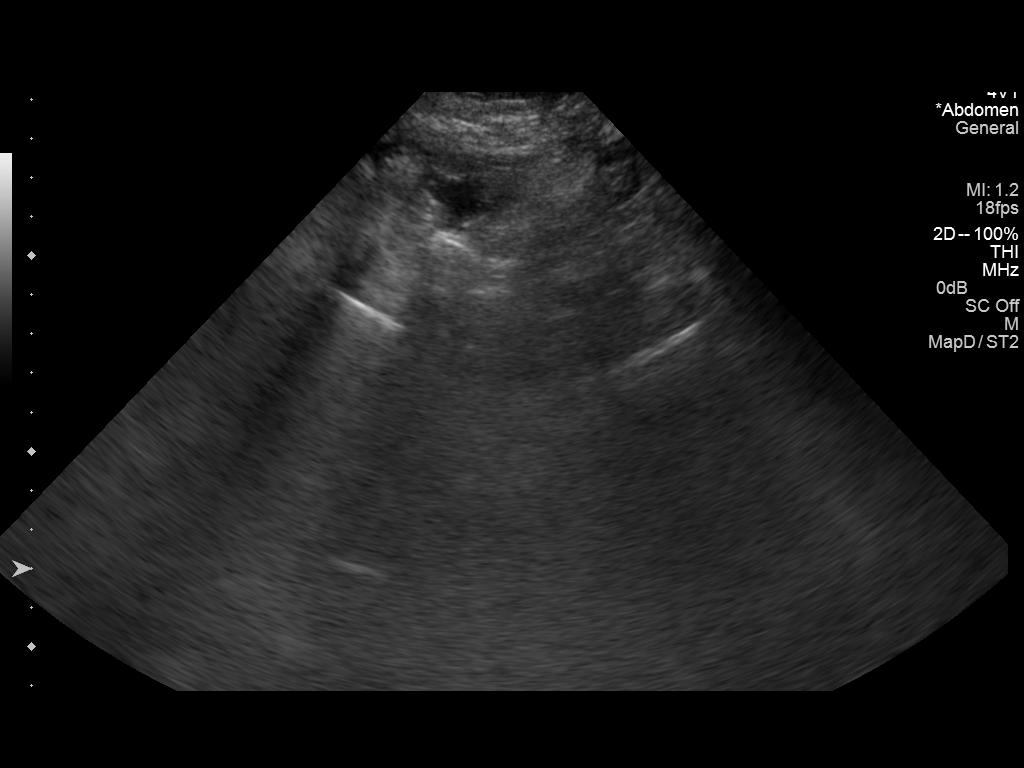
[im 4/4]
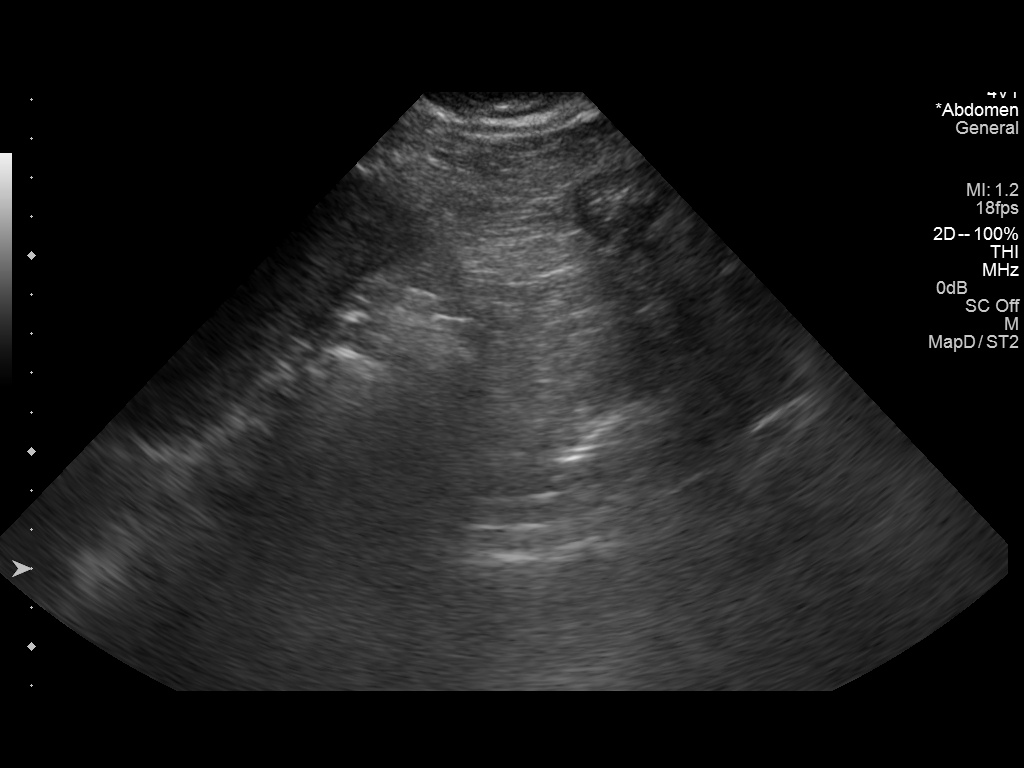

[4 of 4 positions shown; findings below may reference images not displayed]

FINDINGS: There is a small amount of fluid in the right upper quadrant.
Minimal fluid in the lower quadrants bilaterally.
IMPRESSION: Small ascites.  Paracentesis not performed.
